# Patient Record
Sex: Female | Born: 1990 | Race: Black or African American | Hispanic: No | Marital: Single | Smoking: Never smoker
Health system: Southern US, Community
[De-identification: ages and names within clinical notes are randomized; demographics above are authoritative.]

## PROBLEM LIST (undated history)

## (undated) DIAGNOSIS — Z789 Other specified health status: Secondary | ICD-10-CM

## (undated) HISTORY — PX: NO PAST SURGERIES: SHX2092

---
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
## 2010-03-12 ENCOUNTER — Emergency Department (HOSPITAL_COMMUNITY)
Admission: EM | Admit: 2010-03-12 | Discharge: 2010-03-12 | Payer: Self-pay | Source: Home / Self Care | Admitting: Family Medicine

## 2010-06-30 ENCOUNTER — Other Ambulatory Visit (HOSPITAL_COMMUNITY)
Admission: RE | Admit: 2010-06-30 | Discharge: 2010-06-30 | Disposition: A | Discharge status: Home / Self Care | Payer: Self-pay | Source: Ambulatory Visit | Attending: Family Medicine | Admitting: Family Medicine

## 2010-06-30 DIAGNOSIS — Z0000 Encounter for general adult medical examination without abnormal findings: Secondary | ICD-10-CM | POA: Insufficient documentation

## 2010-06-30 DIAGNOSIS — Z113 Encounter for screening for infections with a predominantly sexual mode of transmission: Secondary | ICD-10-CM | POA: Insufficient documentation

## 2010-07-23 ENCOUNTER — Emergency Department (HOSPITAL_COMMUNITY)
Admission: EM | Admit: 2010-07-23 | Discharge: 2010-07-23 | Disposition: A | Discharge status: Home / Self Care | Payer: No Typology Code available for payment source | Attending: Emergency Medicine | Admitting: Emergency Medicine

## 2010-07-23 DIAGNOSIS — M542 Cervicalgia: Secondary | ICD-10-CM | POA: Insufficient documentation

## 2010-07-23 DIAGNOSIS — S139XXA Sprain of joints and ligaments of unspecified parts of neck, initial encounter: Secondary | ICD-10-CM | POA: Insufficient documentation

## 2010-07-23 DIAGNOSIS — R51 Headache: Secondary | ICD-10-CM | POA: Insufficient documentation

## 2010-07-23 DIAGNOSIS — M549 Dorsalgia, unspecified: Secondary | ICD-10-CM | POA: Insufficient documentation

## 2010-07-23 LAB — POCT PREGNANCY, URINE: Preg Test, Ur: NEGATIVE

## 2011-12-02 ENCOUNTER — Other Ambulatory Visit (HOSPITAL_COMMUNITY)
Admission: RE | Admit: 2011-12-02 | Discharge: 2011-12-02 | Disposition: A | Discharge status: Home / Self Care | Payer: Managed Care, Other (non HMO) | Source: Ambulatory Visit | Attending: Family Medicine | Admitting: Family Medicine

## 2011-12-02 DIAGNOSIS — Z01419 Encounter for gynecological examination (general) (routine) without abnormal findings: Secondary | ICD-10-CM | POA: Insufficient documentation

## 2011-12-02 DIAGNOSIS — Z113 Encounter for screening for infections with a predominantly sexual mode of transmission: Secondary | ICD-10-CM | POA: Insufficient documentation

## 2012-02-03 LAB — OB RESULTS CONSOLE RUBELLA ANTIBODY, IGM: Rubella: IMMUNE

## 2012-02-03 LAB — OB RESULTS CONSOLE ANTIBODY SCREEN: Antibody Screen: NEGATIVE

## 2012-02-03 LAB — OB RESULTS CONSOLE GC/CHLAMYDIA
Chlamydia: NEGATIVE
Gonorrhea: NEGATIVE

## 2012-06-22 ENCOUNTER — Inpatient Hospital Stay (HOSPITAL_COMMUNITY)
Admission: AD | Admit: 2012-06-22 | Discharge: 2012-06-22 | Disposition: A | Discharge status: Home / Self Care | Payer: Managed Care, Other (non HMO) | Source: Ambulatory Visit | Attending: Obstetrics and Gynecology | Admitting: Obstetrics and Gynecology

## 2012-06-22 ENCOUNTER — Encounter (HOSPITAL_COMMUNITY): Payer: Self-pay | Admitting: *Deleted

## 2012-06-22 DIAGNOSIS — M549 Dorsalgia, unspecified: Secondary | ICD-10-CM | POA: Insufficient documentation

## 2012-06-22 DIAGNOSIS — O479 False labor, unspecified: Secondary | ICD-10-CM | POA: Insufficient documentation

## 2012-06-22 NOTE — MAU Note (Signed)
Pt c/o of contracting and nonstop lower back pain

## 2012-06-22 NOTE — Treatment Plan (Signed)
Dr Ellyn Hack notified of patients cervical exam. Order to d/c home with labor precautions

## 2012-06-24 ENCOUNTER — Inpatient Hospital Stay (HOSPITAL_COMMUNITY): Payer: Managed Care, Other (non HMO) | Admitting: Anesthesiology

## 2012-06-24 ENCOUNTER — Encounter (HOSPITAL_COMMUNITY): Payer: Self-pay | Admitting: Anesthesiology

## 2012-06-24 ENCOUNTER — Encounter (HOSPITAL_COMMUNITY): Payer: Self-pay

## 2012-06-24 ENCOUNTER — Inpatient Hospital Stay (HOSPITAL_COMMUNITY)
Admission: AD | Admit: 2012-06-24 | Discharge: 2012-06-26 | DRG: 775 | Disposition: A | Discharge status: Home / Self Care | Payer: Managed Care, Other (non HMO) | Source: Ambulatory Visit | Attending: Obstetrics and Gynecology | Admitting: Obstetrics and Gynecology

## 2012-06-24 DIAGNOSIS — D573 Sickle-cell trait: Secondary | ICD-10-CM | POA: Diagnosis present

## 2012-06-24 DIAGNOSIS — O358XX0 Maternal care for other (suspected) fetal abnormality and damage, not applicable or unspecified: Secondary | ICD-10-CM | POA: Diagnosis present

## 2012-06-24 DIAGNOSIS — O9902 Anemia complicating childbirth: Principal | ICD-10-CM | POA: Diagnosis present

## 2012-06-24 HISTORY — DX: Other specified health status: Z78.9

## 2012-06-24 LAB — CBC
MCHC: 33.7 g/dL (ref 30.0–36.0)
Platelets: 203 10*3/uL (ref 150–400)
RDW: 14.1 % (ref 11.5–15.5)
WBC: 9.8 10*3/uL (ref 4.0–10.5)

## 2012-06-24 MED ORDER — BUTORPHANOL TARTRATE 1 MG/ML IJ SOLN
1.0000 mg | INTRAMUSCULAR | Status: DC | PRN
  Administered 2012-06-24: 1 mg via INTRAVENOUS
  Filled 2012-06-24: qty 1

## 2012-06-24 MED ORDER — PRENATAL MULTIVITAMIN CH: 1.0000 | ORAL_TABLET | Freq: Every day | ORAL | Status: DC

## 2012-06-24 MED ORDER — EPHEDRINE 5 MG/ML INJ
10.0000 mg | INTRAVENOUS | Status: DC | PRN
  Filled 2012-06-24: qty 2

## 2012-06-24 MED ORDER — ONDANSETRON HCL 4 MG PO TABS: 4.0000 mg | ORAL_TABLET | ORAL | Status: DC | PRN

## 2012-06-24 MED ORDER — PRENATAL MULTIVITAMIN CH
1.0000 | ORAL_TABLET | Freq: Every day | ORAL | Status: DC
  Administered 2012-06-25 – 2012-06-26 (×2): 1 via ORAL
  Filled 2012-06-24 (×2): qty 1

## 2012-06-24 MED ORDER — OXYTOCIN 40 UNITS IN LACTATED RINGERS INFUSION - SIMPLE MED: 62.5000 mL/h | INTRAVENOUS | Status: DC

## 2012-06-24 MED ORDER — LIDOCAINE HCL (PF) 1 % IJ SOLN
INTRAMUSCULAR | Status: DC | PRN
  Administered 2012-06-24 (×2): 4 mL

## 2012-06-24 MED ORDER — PHENYLEPHRINE 40 MCG/ML (10ML) SYRINGE FOR IV PUSH (FOR BLOOD PRESSURE SUPPORT)
80.0000 ug | PREFILLED_SYRINGE | INTRAVENOUS | Status: DC | PRN
  Filled 2012-06-24: qty 2
  Filled 2012-06-24: qty 5

## 2012-06-24 MED ORDER — IBUPROFEN 600 MG PO TABS: 600.0000 mg | ORAL_TABLET | Freq: Four times a day (QID) | ORAL | Status: DC | PRN

## 2012-06-24 MED ORDER — EPHEDRINE 5 MG/ML INJ
10.0000 mg | INTRAVENOUS | Status: DC | PRN
  Filled 2012-06-24: qty 4
  Filled 2012-06-24: qty 2

## 2012-06-24 MED ORDER — LACTATED RINGERS IV SOLN: 500.0000 mL | INTRAVENOUS | Status: DC | PRN

## 2012-06-24 MED ORDER — TERBUTALINE SULFATE 1 MG/ML IJ SOLN: 0.2500 mg | Freq: Once | INTRAMUSCULAR | Status: DC | PRN

## 2012-06-24 MED ORDER — BENZOCAINE-MENTHOL 20-0.5 % EX AERO: 1.0000 "application " | INHALATION_SPRAY | CUTANEOUS | Status: DC | PRN

## 2012-06-24 MED ORDER — FLEET ENEMA 7-19 GM/118ML RE ENEM: 1.0000 | ENEMA | RECTAL | Status: DC | PRN

## 2012-06-24 MED ORDER — DIPHENHYDRAMINE HCL 50 MG/ML IJ SOLN: 12.5000 mg | INTRAMUSCULAR | Status: DC | PRN

## 2012-06-24 MED ORDER — ONDANSETRON HCL 4 MG/2ML IJ SOLN: 4.0000 mg | Freq: Four times a day (QID) | INTRAMUSCULAR | Status: DC | PRN

## 2012-06-24 MED ORDER — ONDANSETRON HCL 4 MG/2ML IJ SOLN: 4.0000 mg | INTRAMUSCULAR | Status: DC | PRN

## 2012-06-24 MED ORDER — LIDOCAINE HCL (PF) 1 % IJ SOLN
30.0000 mL | INTRAMUSCULAR | Status: DC | PRN
  Filled 2012-06-24 (×2): qty 30

## 2012-06-24 MED ORDER — FENTANYL 2.5 MCG/ML BUPIVACAINE 1/10 % EPIDURAL INFUSION (WH - ANES)
INTRAMUSCULAR | Status: DC | PRN
  Administered 2012-06-24: 14 mL/h via EPIDURAL

## 2012-06-24 MED ORDER — OXYTOCIN BOLUS FROM INFUSION
500.0000 mL | INTRAVENOUS | Status: DC
  Administered 2012-06-24: 500 mL via INTRAVENOUS

## 2012-06-24 MED ORDER — LACTATED RINGERS IV SOLN
INTRAVENOUS | Status: DC
Start: 2012-06-24 — End: 2012-06-24
  Administered 2012-06-24 (×2): via INTRAVENOUS

## 2012-06-24 MED ORDER — WITCH HAZEL-GLYCERIN EX PADS: 1.0000 "application " | MEDICATED_PAD | CUTANEOUS | Status: DC | PRN

## 2012-06-24 MED ORDER — SIMETHICONE 80 MG PO CHEW: 80.0000 mg | CHEWABLE_TABLET | ORAL | Status: DC | PRN

## 2012-06-24 MED ORDER — LACTATED RINGERS IV SOLN: INTRAVENOUS | Status: AC

## 2012-06-24 MED ORDER — OXYCODONE-ACETAMINOPHEN 5-325 MG PO TABS: 1.0000 | ORAL_TABLET | ORAL | Status: DC | PRN

## 2012-06-24 MED ORDER — LACTATED RINGERS IV SOLN
500.0000 mL | Freq: Once | INTRAVENOUS | Status: AC
  Administered 2012-06-24: 500 mL via INTRAVENOUS

## 2012-06-24 MED ORDER — CITRIC ACID-SODIUM CITRATE 334-500 MG/5ML PO SOLN: 30.0000 mL | ORAL | Status: DC | PRN

## 2012-06-24 MED ORDER — OXYTOCIN 40 UNITS IN LACTATED RINGERS INFUSION - SIMPLE MED
1.0000 m[IU]/min | INTRAVENOUS | Status: DC
  Administered 2012-06-24: 2 m[IU]/min via INTRAVENOUS
  Filled 2012-06-24: qty 1000

## 2012-06-24 MED ORDER — IBUPROFEN 600 MG PO TABS
600.0000 mg | ORAL_TABLET | Freq: Four times a day (QID) | ORAL | Status: DC
  Administered 2012-06-24 – 2012-06-26 (×8): 600 mg via ORAL
  Filled 2012-06-24 (×7): qty 1

## 2012-06-24 MED ORDER — FENTANYL 2.5 MCG/ML BUPIVACAINE 1/10 % EPIDURAL INFUSION (WH - ANES)
14.0000 mL/h | INTRAMUSCULAR | Status: DC | PRN
  Filled 2012-06-24: qty 125

## 2012-06-24 MED ORDER — OXYTOCIN 40 UNITS IN LACTATED RINGERS INFUSION - SIMPLE MED: 62.5000 mL/h | INTRAVENOUS | Status: AC | PRN

## 2012-06-24 MED ORDER — DIBUCAINE 1 % RE OINT: 1.0000 "application " | TOPICAL_OINTMENT | RECTAL | Status: DC | PRN

## 2012-06-24 MED ORDER — ZOLPIDEM TARTRATE 5 MG PO TABS: 5.0000 mg | ORAL_TABLET | Freq: Every evening | ORAL | Status: DC | PRN

## 2012-06-24 MED ORDER — DIPHENHYDRAMINE HCL 25 MG PO CAPS: 25.0000 mg | ORAL_CAPSULE | Freq: Four times a day (QID) | ORAL | Status: DC | PRN

## 2012-06-24 MED ORDER — LANOLIN HYDROUS EX OINT: TOPICAL_OINTMENT | CUTANEOUS | Status: DC | PRN

## 2012-06-24 MED ORDER — TETANUS-DIPHTH-ACELL PERTUSSIS 5-2.5-18.5 LF-MCG/0.5 IM SUSP: 0.5000 mL | Freq: Once | INTRAMUSCULAR | Status: DC

## 2012-06-24 MED ORDER — FENTANYL 2.5 MCG/ML BUPIVACAINE 1/10 % EPIDURAL INFUSION (WH - ANES)
14.0000 mL/h | INTRAMUSCULAR | Status: DC | PRN
Start: 2012-06-24 — End: 2012-06-24

## 2012-06-24 MED ORDER — MEASLES, MUMPS & RUBELLA VAC ~~LOC~~ INJ
0.5000 mL | INJECTION | Freq: Once | SUBCUTANEOUS | Status: DC
  Filled 2012-06-24: qty 0.5

## 2012-06-24 MED ORDER — PHENYLEPHRINE 40 MCG/ML (10ML) SYRINGE FOR IV PUSH (FOR BLOOD PRESSURE SUPPORT)
80.0000 ug | PREFILLED_SYRINGE | INTRAVENOUS | Status: DC | PRN
  Filled 2012-06-24: qty 2

## 2012-06-24 MED ORDER — INFLUENZA VIRUS VACC SPLIT PF IM SUSP
0.5000 mL | INTRAMUSCULAR | Status: AC
  Administered 2012-06-26: 0.5 mL via INTRAMUSCULAR
  Filled 2012-06-24: qty 0.5

## 2012-06-24 MED ORDER — ACETAMINOPHEN 325 MG PO TABS: 650.0000 mg | ORAL_TABLET | ORAL | Status: DC | PRN

## 2012-06-24 MED ORDER — SENNOSIDES-DOCUSATE SODIUM 8.6-50 MG PO TABS
2.0000 | ORAL_TABLET | Freq: Every day | ORAL | Status: DC
  Administered 2012-06-25: 2 via ORAL

## 2012-06-24 NOTE — Progress Notes (Signed)
Patient ID: Rose Barnes, female   DOB: 1991-01-25, 22 y.o.   MRN: [id number] Contractions are q 2-3 minutes and the cervix is 10 cm 100% effaced and the vertex is at 0/+1 station.

## 2012-06-24 NOTE — Progress Notes (Signed)
Patient ID: Rose Barnes, female   DOB: [birth year], 22 y.o.   MRN: [id number] Pt was admitted and has an epidural. The RN just examined her and the cervix was 3-4 cm 80-90 % effaced and the vertex was at - 1 station Pitocin is at 2 mu/ minute

## 2012-06-24 NOTE — Anesthesia Postprocedure Evaluation (Signed)
Anesthesia Post Note  Patient: @Rose Barnes @Rose Barnes   Procedure(s) Performed: CLE/C/S  Anesthesia type: Epidural  Patient location: Mother/Baby  Post pain: Pain level controlled  Post assessment: Post-op Vital signs reviewed  Last Vitals: BP 115/76  Pulse 74  Temp(Src) 36.6 C (Axillary)  Resp 20  SpO2 100%  Post vital signs: Reviewed  Level of consciousness: awake  Complications: No apparent anesthesia complications

## 2012-06-24 NOTE — Anesthesia Preprocedure Evaluation (Signed)
Anesthesia Evaluation  Patient identified by MRN, date of birth, ID band Patient awake    Reviewed: Allergy & Precautions, H&P , NPO status , Patient's Chart, lab work & pertinent test results  History of Anesthesia Complications (+) PONV  Airway Mallampati: III TM Distance: >3 FB Neck ROM: full    Dental no notable dental hx. (+) Teeth Intact   Pulmonary neg pulmonary ROS,  breath sounds clear to auscultation  Pulmonary exam normal       Cardiovascular negative cardio ROS  Rhythm:regular Rate:Normal     Neuro/Psych negative neurological ROS  negative psych ROS   GI/Hepatic negative GI ROS, Neg liver ROS,   Endo/Other  negative endocrine ROS  Renal/GU negative Renal ROS  negative genitourinary   Musculoskeletal negative musculoskeletal ROS (+)   Abdominal Normal abdominal exam  (+)   Peds  Hematology negative hematology ROS (+)   Anesthesia Other Findings   Reproductive/Obstetrics (+) Pregnancy                           Anesthesia Physical Anesthesia Plan  ASA: II  Anesthesia Plan: Epidural   Post-op Pain Management:    Induction:   Airway Management Planned:   Additional Equipment:   Intra-op Plan:   Post-operative Plan:   Informed Consent: I have reviewed the patients History and Physical, chart, labs and discussed the procedure including the risks, benefits and alternatives for the proposed anesthesia with the patient or authorized representative who has indicated his/her understanding and acceptance.     Plan Discussed with: Anesthesiologist  Anesthesia Plan Comments:         Anesthesia Quick Evaluation

## 2012-06-24 NOTE — MAU Note (Signed)
Pt states contractions started yesterday and are about every 1.5-2 minutes apart. Denies leaking of fluid or vaginal bleeding

## 2012-06-24 NOTE — Progress Notes (Signed)
Patient ID: Rose Barnes, female   DOB: March 04, 1991, 22 y.o.   MRN: [id number] Pitocin at 6 mu/ minute and the contractions are q 2-3 minutes. She had SROM at 11:30 AM and the cervix was 4 cm at that time. Now, the cervix is 7 cm 100 % effaced and the vertex is at 0 station.

## 2012-06-24 NOTE — H&P (Signed)
Rose Barnes is a 22 y.o. female G1P0 at 47 weeks (EDD 07/11/12 by LMP c/w 18 week [place])  presenting for early labor with cervical change to 3 cm and very uncomfortable.  Prenatal care significant for a late start at 18 weeks.  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  She has been followed up with a normal growth [place] and seen a pediatric orthopedist with a plan to bring the baby to them for evaluation at 2 weeks postpartum.  She is a sickle trait carrier.  Otherwise no issues except a UTI at 18 weeks treated with bactrim.  Maternal Medical History:  Reason for admission: Contractions.   Contractions: Onset was 3-5 hours ago.   Frequency: regular.   Perceived severity is moderate.    Fetal activity: Perceived fetal activity is normal.    Prenatal Complications - Diabetes: none.    OB History   Grav Para Term Preterm Abortions TAB SAB Ect Mult Living   1              Past Medical History  Diagnosis Date  . Medical history non-contributory    Past Surgical History  Procedure Laterality Date  . No past surgeries     Family History: family history is not on file. Social History:  reports that she has never smoked. She does not have any smokeless tobacco history on file. She reports that she does not drink alcohol or use illicit drugs.   Prenatal Transfer Tool  Maternal Diabetes: No Genetic Screening: Normal Maternal Ultrasounds/Referrals: Abnormal:  Findings:   Other:Bilateral Club feet Fetal Ultrasounds or other Referrals:  Other: Pediatric orthopedist Maternal Substance Abuse:  No Significant Maternal Medications:  None Significant Maternal Lab Results:  None Other Comments:  Pt is a sickle trait carrier.  ROS  Dilation: 3 Effacement (%): 80 Station: -2 Exam by:: L. Dupell, RN  Blood pressure 109/75, pulse 80, temperature 98.6 F (37 C), temperature source Oral, resp. rate 20, SpO2 99.00%. Maternal Exam:  Uterine Assessment:  Contraction strength is moderate.  Contraction frequency is regular.   Abdomen: Patient reports no abdominal tenderness. Fetal presentation: vertex  Introitus: Normal vulva. Normal vagina.  Pelvis: adequate for delivery.      Physical Exam  Constitutional: She appears well-developed and well-nourished.  Cardiovascular: Normal rate and regular rhythm.   Respiratory: Effort normal and breath sounds normal.  GI: Soft.  Genitourinary: Vagina normal.  Uterus gravid  Neurological: She is alert.  Psychiatric: She has a normal mood and affect. Her behavior is normal.    Prenatal labs: ABO, Rh: B/Positive/-- (11/06 0000) Antibody: Negative (11/06 0000) Rubella: Immune (11/06 0000) RPR: Nonreactive (11/06 0000)  HBsAg: Negative (11/06 0000)  HIV: Non-reactive (11/06 0000)  GBS: Negative (03/28 0000)  One hour GCT 108 Sickle trait carrier CF negative Quad screen negative  Assessment/Plan: Pt came in to MAU uncomfortable and was observed from 2am to 430am with cervical change from 2 to 3cm per RN.  She also c/o excessive discomfort and requested pain management.  She is just receiving an epidural.  The contractions are 1-6 minutes and I instructed the RN to begin pitocin after the epidural if the contractions were not regular.  Oliver Pila 06/24/2012, 7:29 AM

## 2012-06-24 NOTE — Progress Notes (Signed)
Patient ID: Rose Barnes, female   DOB: 02/19/1991, 22 y.o.   MRN: [id number] Delivery note  Pt pushed well and delivered a living female infant OA over an intact perineum. Apgars 9 and 9 at 1 and 5 minutes. The placenta was intact and the uterus was normal. There were no lacerations. The baby's feet did not look excessively clubbed. EBL 300 cc's.

## 2012-06-24 NOTE — Anesthesia Procedure Notes (Signed)
Epidural Patient location during procedure: OB Start time: 06/24/2012 7:10 AM  Staffing Anesthesiologist: Jeremian Whitby A. Performed by: anesthesiologist   Preanesthetic Checklist Completed: patient identified, site marked, surgical consent, pre-op evaluation, timeout performed, IV checked, risks and benefits discussed and monitors and equipment checked  Epidural Patient position: sitting Prep: site prepped and draped and DuraPrep Patient monitoring: continuous pulse ox and blood pressure Approach: midline Injection technique: LOR air  Needle:  Needle type: Tuohy  Needle gauge: 17 G Needle length: 9 cm and 9 Needle insertion depth: 4 cm Catheter type: closed end flexible Catheter size: 19 Gauge Catheter at skin depth: 9 cm Test dose: negative and Other  Assessment Events: blood not aspirated, injection not painful, no injection resistance, negative IV test and no paresthesia  Additional Notes Patient identified. Risks and benefits discussed including failed block, incomplete  Pain control, post dural puncture headache, nerve damage, paralysis, blood pressure Changes, nausea, vomiting, reactions to medications-both toxic and allergic and post Partum back pain. All questions were answered. Patient expressed understanding and wished to proceed. Sterile technique was used throughout procedure. Epidural site was Dressed with sterile barrier dressing. No paresthesias, signs of intravascular injection Or signs of intrathecal spread were encountered.  Patient was more comfortable after the epidural was dosed. Please see RN's note for documentation of vital signs and FHR which are stable.

## 2012-06-25 LAB — CBC
HCT: 31.7 % — ABNORMAL LOW (ref 36.0–46.0)
Hemoglobin: 10.7 g/dL — ABNORMAL LOW (ref 12.0–15.0)
MCHC: 33.8 g/dL (ref 30.0–36.0)
RBC: 4.12 MIL/uL (ref 3.87–5.11)

## 2012-06-25 NOTE — Lactation Note (Signed)
This note was copied from the chart of Rose Kylieann Farrel. Lactation Consultation Note Mom holding baby in football on the left when I enter room. The baby's position and latch were perfect, and mom was comfortable and relaxed. Asked mom if she had help getting baby positioned like that, and she said that in previous feedings, she did receive help from RN, and at this feeding, just mom and MGM were able to position baby. Mom states latch is comfortable. Mom states she is able to hand express. Baby maintains deep latch with rhythmic sucking and audible swallowing.  Mom and MGM had numerous questions; extensive teaching done and lots of encouragement given. Lactation brochure provided, mom made aware of lactation services and [facility]. Enc mom to call if she has any concerns.  Patient Name: Rose Barnes YNWGN'F Date: 06/25/2012     Maternal Data    Feeding    LATCH Score/Interventions                      Lactation Tools Discussed/Used     Consult Status      Lenard Forth 06/25/2012, 3:06 PM

## 2012-06-25 NOTE — Progress Notes (Signed)
Patient ID: Rose Barnes, female   DOB: [birth year], 22 y.o.   MRN: [id number] #1 afebrile BP normal HGB stable no complaints

## 2012-06-26 MED ORDER — OXYCODONE-ACETAMINOPHEN 5-325 MG PO TABS: 1.0000 | ORAL_TABLET | Freq: Four times a day (QID) | ORAL | Status: DC | PRN

## 2012-06-26 MED ORDER — IBUPROFEN 600 MG PO TABS: 600.0000 mg | ORAL_TABLET | Freq: Four times a day (QID) | ORAL | Status: DC | PRN

## 2012-06-26 NOTE — Progress Notes (Signed)
Patient ID: Rose Barnes, female   DOB: 1990-09-21, 22 y.o.   MRN: [id number] #2 afebrile BP normal for d/c

## 2012-06-26 NOTE — Discharge Summary (Signed)
[name], Rose Barnes NO.:  [id number]  MEDICAL RECORD NO.:  [id number]  LOCATION:  9142                          FACILITY:  [facility]  PHYSICIAN:  Malachi Pro. Ambrose Mantle, M.D. DATE OF BIRTH:  1991-03-18  DATE OF ADMISSION:  06/24/2012 DATE OF DISCHARGE:  06/26/2012                              DISCHARGE SUMMARY   A 22 year old female, para 0, gravida 1, EDC July 11, 2012, by last period compatible with an 18-week ultrasound, presented for early labor with cervical change to 3 cm and very uncomfortable.  Prenatal care significant for late started 18 weeks.  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  She had been followed up with a normal growth scan on ultrasound and she saw a pediatric orthopedist with a plan to bring the baby to them for evaluation at 2 weeks' postpartum.  She was a sickle trait carrier, otherwise no issues except for urinary tract at 18 weeks, treated with Bactrim.  Blood group and type B positive, negative antibody, rubella immune, RPR nonreactive.  Hepatitis B surface antigen negative, HIV negative, group B strep negative.  One-hour Glucola 108, sickle trait carrier, cystic fibrosis negative, quad screen negative.  The patient came into MAU uncomfortable and was observed from 2 a.m. to 4:30 a.m. with a cervical change from 2-3 cm per the RN.  She also complained of excessive discomfort and requested pain management.  She received an epidural and was admitted by Dr. Senaida Ores.  Contractions were every 1- 6 minutes and subsequently the patient received an epidural.  At 8:06 a.m., the cervix was 3-4 cm dilated, 80% to 90% effaced, vertex at a -1 station.  Pitocin was at 2 milliunits a minute.  At 12:52 a.m., Pitocin was at 6 milliunits a minute.  Contractions every 2-3 minutes.  She had spontaneous rupture of membranes at 11:30 a.m., cervix was 4 cm at that time.  At 12:52 p.m., the cervix was 7 cm and  100% vertex at 0 station. She was 10 cm at 1:57 p.m.  She pushed well and delivered a living female infant 7 pounds 11 ounces OA over an intact perineum by Dr. Ambrose Mantle. Apgars were 9 and 9 at one and five minutes.  Placenta was intact. Uterus normal.  No lacerations.  Baby's feet did not look excessively clubbed.  Blood loss about 300 mL.  Postpartum, the patient did well and was discharged on the second postpartum day.  Initial hemoglobin 11.6, hematocrit 34.4, white count 9800, platelet count 203,000.  Followup hemoglobin 10.7.  RPR nonreactive.  FINAL DIAGNOSES:  Intrauterine pregnancy close to 38 weeks, delivered OA.  OPERATION:  Spontaneous delivery, OA.  FINAL CONDITION:  Improved.  INSTRUCTIONS:  Include our regular discharge instruction booklet as well as our after visit summary.  She is advised to continue her prenatal pills.  Prescriptions for Percocet 5/325, 30 tablets and Motrin 600 mg 30 tablets, 1 every 6 hours as needed for pain were given at discharge. She is to return to the office in 6 weeks for followup examination.     Malachi Pro. Ambrose Mantle, M.D.     TFH/MEDQ  D:  06/26/2012  T:  06/26/2012  Job:  [id number]

## 2012-07-05 ENCOUNTER — Inpatient Hospital Stay (HOSPITAL_COMMUNITY): Admission: RE | Admit: 2012-07-05 | Payer: No Typology Code available for payment source | Source: Ambulatory Visit

## 2012-07-08 ENCOUNTER — Ambulatory Visit (HOSPITAL_COMMUNITY)
Admission: RE | Admit: 2012-07-08 | Discharge: 2012-07-08 | Disposition: A | Discharge status: Home / Self Care | Payer: Managed Care, Other (non HMO) | Source: Ambulatory Visit | Attending: Pediatrics | Admitting: Pediatrics

## 2012-07-08 NOTE — Lactation Note (Signed)
Infant Lactation Consultation Outpatient Visit Note  Patient Name: Rose Barnes Date of Birth: 04/28/1990                06/24/12 Birth Weight:                                  7-11.6    Hospital D?C wt  7-6    First MD visit 7-12     Today's wt 8-6.6           Gestational Age at Delivery: Gestational Age: <None> Type of Delivery:   Breastfeeding History Frequency of Breastfeeding: Mom stopped breast feeding after a week due to sore nipples, and has been pumping and bottle feeding He is now 33 days old  She has given formula only once Length of Feeding:  Voids: 10 per day Stools: 7 per day  Supplementing / Method: Pumping:   Type of Pump:Medela DEP   Frequency:6 times a day, with an 8 hour break at night  Volume:  Was initially getting 6 ounces at a time, but over the last 2 days, mom has a decreased supply of 2 ounces at a pumping  Comments:   Mom was losing her supply by not pumping for 8 hours, at night.    Consultation Evaluation:   On exam, mom's breasts are full, soft, with easy to express breast milk. I had mom breast feed twice on left breast, in football hold, and twice on left, also football hold. He transferred 80 mls in about 40 minutes. Mom was surprised that latching did not hurt. She has been bringing her breast to the baby, and obtaining a shallow latch. With proper positioning, and bringing  Rose Barnes to her breast, waiting for a wide mouth, mom was able to latch Rose Barnes deeply, with strong suckles and lots of audible swallows. Mom plans to breast feed on cue, and only pump to store milk for when she needs to go back to work. I feel her  Milk supply will be fine with ad lib breast feeding. She knows to call lactation for questions/concerns and out-patient lactation as needed. Mom also taught to empty one breast before going to the other, to provide hind milk to her baby. [facility] [employer] was very clam, and appeared satiated, after feeding  Initial Feeding  Assessment: Pre-feed Weight:3816 Post-feed RUEAVW:0981 Amount Transferred:40 Comments:right breast   Additional Feeding Assessment: Pre-feed XBJYNW:2956 Post-feed OZHYQM:5784 Amount Transferred:12 Comments:right breast  Additional Feeding Assessment: Pre-feed ONGEXB:2841 Post-feed LKGMWN:0272 Amount Transferred:12 Comments:  Left breast      Fed from left breast again -  3896 - 16 mls   Total Breast milk Transferred this Visit: 80 mls  Total Supplement Given: none  Additional Interventions:   Follow-Up mom will call for questions/concerns, or follow-up lactation consult, as needed      Alfred Levins 07/08/2012, 4:07 PM

## 2013-02-27 ENCOUNTER — Emergency Department (HOSPITAL_COMMUNITY)
Admission: EM | Admit: 2013-02-27 | Discharge: 2013-02-27 | Disposition: A | Discharge status: Home / Self Care | Payer: Medicaid Other | Attending: Emergency Medicine | Admitting: Emergency Medicine

## 2013-02-27 ENCOUNTER — Encounter (HOSPITAL_COMMUNITY): Payer: Self-pay | Admitting: Emergency Medicine

## 2013-02-27 DIAGNOSIS — W268XXA Contact with other sharp object(s), not elsewhere classified, initial encounter: Secondary | ICD-10-CM | POA: Insufficient documentation

## 2013-02-27 DIAGNOSIS — Y929 Unspecified place or not applicable: Secondary | ICD-10-CM | POA: Insufficient documentation

## 2013-02-27 DIAGNOSIS — S61209A Unspecified open wound of unspecified finger without damage to nail, initial encounter: Secondary | ICD-10-CM | POA: Insufficient documentation

## 2013-02-27 DIAGNOSIS — Y93G1 Activity, food preparation and clean up: Secondary | ICD-10-CM | POA: Insufficient documentation

## 2013-02-27 DIAGNOSIS — S61211A Laceration without foreign body of left index finger without damage to nail, initial encounter: Secondary | ICD-10-CM

## 2013-02-27 DIAGNOSIS — Z23 Encounter for immunization: Secondary | ICD-10-CM | POA: Insufficient documentation

## 2013-02-27 MED ORDER — TETANUS-DIPHTH-ACELL PERTUSSIS 5-2.5-18.5 LF-MCG/0.5 IM SUSP
0.5000 mL | Freq: Once | INTRAMUSCULAR | Status: AC
  Administered 2013-02-27: 0.5 mL via INTRAMUSCULAR
  Filled 2013-02-27: qty 0.5

## 2013-02-27 NOTE — ED Notes (Signed)
Bacitracin placed on laceration and bandaged, patient given instructions for future bandages

## 2013-02-27 NOTE — ED Provider Notes (Signed)
CSN: [id number]     Arrival date & time 02/27/13  0272 History   First MD Initiated Contact with Patient 02/27/13 0804     Chief Complaint  Patient presents with  . Laceration    left hand   (Consider location/radiation/quality/duration/timing/severity/associated sxs/prior Treatment) HPI Comments: Patient is a 23 year old female who presents today with laceration to her proximal left index finger. Late last night she was washing dishes when a broken dish cut her hand. She rinsed it out and covered it with Neosporin. She is having a burning pain to the area, worse with movement. She is left hand dominant. She is unsure of her last tetanus shot. No other medical problems.   The history is provided by the patient. No language interpreter was used.    Past Medical History  Diagnosis Date  . Medical history non-contributory    Past Surgical History  Procedure Laterality Date  . No past surgeries     No family history on file. History  Substance Use Topics  . Smoking status: Never Smoker   . Smokeless tobacco: Not on file  . Alcohol Use: No   OB History   Grav Para Term Preterm Abortions TAB SAB Ect Mult Living   1 1 1       1      Review of Systems  Constitutional: Negative for fever and chills.  Respiratory: Negative for shortness of breath.   Cardiovascular: Negative for chest pain.  Musculoskeletal: Positive for myalgias.  Skin: Positive for wound.  Neurological: Negative for numbness.  All other systems reviewed and are negative.    Allergies  Review of patient's allergies indicates no known allergies.  Home Medications   Current Outpatient Rx  Name  Route  Sig  Dispense  Refill  . calcium carbonate (TUMS - DOSED IN MG ELEMENTAL CALCIUM) 500 MG chewable tablet   Oral   Chew 1 tablet by mouth daily as needed for heartburn.         . diphenhydrAMINE (BENADRYL) 25 MG tablet   Oral   Take 25 mg by mouth every 6 (six) hours as needed for itching.         Marland Kitchen  ibuprofen (ADVIL,MOTRIN) 600 MG tablet   Oral   Take 1 tablet (600 mg total) by mouth every 6 (six) hours as needed for pain.   30 tablet   0   . oxyCODONE-acetaminophen (PERCOCET/ROXICET) 5-325 MG per tablet   Oral   Take 1 tablet by mouth every 6 (six) hours as needed.   30 tablet   0   . Prenatal Vit-Fe Fumarate-FA (PRENATAL MULTIVITAMIN) TABS   Oral   Take 1 tablet by mouth daily at 12 noon.          BP 125/82  Pulse 85  Temp(Src) 97.1 F (36.2 C) (Oral)  Resp 16  SpO2 100%  LMP 01/01/2013  Breastfeeding? No Physical Exam  Nursing note and vitals reviewed. Constitutional: She is oriented to person, place, and time. She appears well-developed and well-nourished. No distress.  HENT:  Head: Normocephalic and atraumatic.  Right Ear: External ear normal.  Left Ear: External ear normal.  Nose: Nose normal.  Mouth/Throat: Oropharynx is clear and moist.  Eyes: Conjunctivae are normal.  Neck: Normal range of motion.  Cardiovascular: Normal rate, regular rhythm, normal heart sounds, intact distal pulses and normal pulses.   Cap refill < 3 seconds in all fingers  Pulmonary/Chest: Effort normal and breath sounds normal. No stridor. No  respiratory distress. She has no wheezes. She has no rales.  Abdominal: Soft. She exhibits no distension.  Musculoskeletal: Normal range of motion.  Able to flex and extend left index finger. Strength mildly limited due to pain.   Neurological: She is alert and oriented to person, place, and time. She has normal strength.  Neurovascularly intact. Sensation intact.   Skin: Skin is warm and dry. She is not diaphoretic. No erythema.  2 cm elliptical laceration on proximal left index finger dorsally. No foreign bodies appreciated.   Psychiatric: She has a normal mood and affect. Her behavior is normal.    ED Course  Procedures (including critical care time) Labs Review Labs Reviewed - No data to display Imaging Review No results  found.  LACERATION REPAIR Performed by: Junious Silk Authorized by: Junious Silk Consent: Verbal consent obtained. Risks and benefits: risks, benefits and alternatives were discussed Consent given by: patient Patient identity confirmed: provided demographic data Prepped and Draped in normal sterile fashion Wound explored  Laceration Location: left index finger  Laceration Length: 2 cm  No Foreign Bodies seen or palpated  Anesthesia: local infiltration  Local anesthetic: lidocaine 2%  Anesthetic total: 3 ml  Irrigation method: syringe Amount of cleaning: standard  Skin closure: 4-0 Ethilon  Number of sutures: 4  Technique: simple interrupted   Patient tolerance: Patient tolerated the procedure well with no immediate complications.    EKG Interpretation   None       MDM   1. Laceration of left index finger w/o foreign body w/o damage to nail, initial encounter    Tdap booster given. Wound cleaning complete with pressure irrigation, bottom of wound visualized, no foreign bodies appreciated. Laceration occurred < 10 hours prior to repair which was well tolerated. Pt has no co morbidities to effect normal wound healing. Discussed suture home care w pt and answered questions. Pt to f-u for wound check and suture removal in 7 days. Pt is hemodynamically stable w no complaints prior to dc.       Mora Bellman, PA-C 02/27/13 [phone]

## 2013-02-27 NOTE — ED Provider Notes (Signed)
Medical screening examination/treatment/procedure(s) were performed by non-physician practitioner and as supervising physician I was immediately available for consultation/collaboration.  EKG Interpretation   None         Joya Gaskins, MD 02/27/13 [id number]

## 2013-02-27 NOTE — ED Notes (Signed)
Patient states cut hand while washing dishes last night, laceration to left index finger, bleeding controlled

## 2013-03-06 ENCOUNTER — Encounter (HOSPITAL_COMMUNITY): Payer: Self-pay | Admitting: Emergency Medicine

## 2013-03-06 ENCOUNTER — Emergency Department (HOSPITAL_COMMUNITY)
Admission: EM | Admit: 2013-03-06 | Discharge: 2013-03-06 | Disposition: A | Discharge status: Home / Self Care | Payer: Medicaid Other | Attending: Emergency Medicine | Admitting: Emergency Medicine

## 2013-03-06 DIAGNOSIS — Z4802 Encounter for removal of sutures: Secondary | ICD-10-CM | POA: Insufficient documentation

## 2013-03-06 NOTE — ED Provider Notes (Signed)
CSN: [id number]     Arrival date & time 03/06/13  0732 History   First MD Initiated Contact with Patient 03/06/13 0740     Chief Complaint  Patient presents with  . Suture / Staple Removal   (Consider location/radiation/quality/duration/timing/severity/associated sxs/prior Treatment) HPI Comments: Rose Barnes is a 22 yo female who presents for suture removal.  Lac to L hand near 2nd MCPJ.  Four sutures placed, but one has come out.  No purulent d/c or erythema today.  Pt did go to UC 1 day after sutures were placed and was given Abx (bactrim) and vicodin.  No issues since then.    Pt is o/w healthy w/o complaint.     Patient is a 22 y.o. female presenting with suture removal. The history is provided by the patient.  Suture / Staple Removal This is a new problem. The current episode started more than 2 days ago. The problem occurs constantly (Sutures placed 1 wk ago). The problem has been gradually improving. Pertinent negatives include no chest pain, no abdominal pain, no headaches and no shortness of breath. Nothing aggravates the symptoms. Nothing relieves the symptoms. She has tried nothing for the symptoms. The treatment provided significant relief.    Past Medical History  Diagnosis Date  . Medical history non-contributory    Past Surgical History  Procedure Laterality Date  . No past surgeries     No family history on file. History  Substance Use Topics  . Smoking status: Never Smoker   . Smokeless tobacco: Not on file  . Alcohol Use: No   OB History   Grav Para Term Preterm Abortions TAB SAB Ect Mult Living   1 1 1       1      Review of Systems  Constitutional: Negative.   Respiratory: Negative for shortness of breath.   Cardiovascular: Negative for chest pain.  Gastrointestinal: Negative for abdominal pain.  Musculoskeletal:       L hand laceration healing  Neurological: Negative for headaches.  All other systems reviewed and are negative.    Allergies    Review of patient's allergies indicates no known allergies.  Home Medications  No current outpatient prescriptions on file. BP 110/68  Pulse 60  Temp(Src) 98 F (36.7 C) (Oral)  Resp 20  SpO2 99%  LMP 01/01/2013 Physical Exam  Vitals reviewed. Constitutional: She is oriented to person, place, and time. She appears well-developed and well-nourished.  HENT:  Head: Normocephalic and atraumatic.  Eyes: Conjunctivae are normal. Pupils are equal, round, and reactive to light. Right eye exhibits no discharge. Left eye exhibits no discharge.  Neck: Normal range of motion. Neck supple.  Cardiovascular: Normal rate and regular rhythm.   Pulmonary/Chest: Effort normal and breath sounds normal. She has no wheezes. She has no rales.  Abdominal: Soft. Bowel sounds are normal. There is no tenderness. There is no rebound.  Musculoskeletal:       Hands: Neurological: She is alert and oriented to person, place, and time. She has normal reflexes.    ED Course  Procedures (including critical care time) Labs Review Labs Reviewed - No data to display Imaging Review No results found.  EKG Interpretation   None       MDM  No diagnosis found. Plan to remove sutures.  Keep wound dressed.  Continue with abx and pain rx as needed.  F/U prn.      Darlys Gales, MD 03/06/13 [phone]

## 2013-03-06 NOTE — ED Provider Notes (Signed)
7:55 AM Patient presents for suture removal. The wound is well healed without signs of infection.  The sutures are removed. Wound care and activity instructions given. Return prn.  SUTURE REMOVAL Performed by: Emilia Beck  Consent: Verbal consent obtained. Patient identity confirmed: provided demographic data Time out: Immediately prior to procedure a "time out" was called to verify the correct patient, procedure, equipment, support staff and site/side marked as required.  Location details: left hand, near 2nd MCPJ  Wound Appearance: clean  Sutures/Staples Removed: 3, 1 fell out yesterday  Facility: sutures placed in this facility Patient tolerance: Patient tolerated the procedure well with no immediate complications.     Emilia Beck, [place] 03/06/13 [phone]

## 2013-03-06 NOTE — ED Notes (Addendum)
Patient here for suture removal, placed on 02/27/13,

## 2013-03-08 NOTE — ED Provider Notes (Signed)
Medical screening examination/treatment/procedure(s) were performed by non-physician practitioner and as supervising physician I was immediately available for consultation/collaboration.  EKG Interpretation   None         Garland Hincapie, MD 03/08/13 0649 

## 2013-08-19 ENCOUNTER — Emergency Department (HOSPITAL_COMMUNITY)
Admission: EM | Admit: 2013-08-19 | Discharge: 2013-08-19 | Disposition: A | Discharge status: Home / Self Care | Payer: Medicaid Other | Attending: Emergency Medicine | Admitting: Emergency Medicine

## 2013-08-19 ENCOUNTER — Encounter (HOSPITAL_COMMUNITY): Payer: Self-pay | Admitting: Emergency Medicine

## 2013-08-19 DIAGNOSIS — R296 Repeated falls: Secondary | ICD-10-CM | POA: Insufficient documentation

## 2013-08-19 DIAGNOSIS — Y9389 Activity, other specified: Secondary | ICD-10-CM | POA: Insufficient documentation

## 2013-08-19 DIAGNOSIS — L02416 Cutaneous abscess of left lower limb: Secondary | ICD-10-CM

## 2013-08-19 DIAGNOSIS — L02419 Cutaneous abscess of limb, unspecified: Secondary | ICD-10-CM | POA: Insufficient documentation

## 2013-08-19 DIAGNOSIS — Y92009 Unspecified place in unspecified non-institutional (private) residence as the place of occurrence of the external cause: Secondary | ICD-10-CM | POA: Insufficient documentation

## 2013-08-19 DIAGNOSIS — IMO0002 Reserved for concepts with insufficient information to code with codable children: Secondary | ICD-10-CM | POA: Insufficient documentation

## 2013-08-19 DIAGNOSIS — L03119 Cellulitis of unspecified part of limb: Secondary | ICD-10-CM

## 2013-08-19 DIAGNOSIS — Z79899 Other long term (current) drug therapy: Secondary | ICD-10-CM | POA: Insufficient documentation

## 2013-08-19 MED ORDER — CEPHALEXIN 500 MG PO CAPS: 500.0000 mg | ORAL_CAPSULE | Freq: Four times a day (QID) | ORAL | Status: AC

## 2013-08-19 MED ORDER — SULFAMETHOXAZOLE-TRIMETHOPRIM 800-160 MG PO TABS: 1.0000 | ORAL_TABLET | Freq: Two times a day (BID) | ORAL | Status: AC

## 2013-08-19 NOTE — ED Provider Notes (Signed)
CSN: [id number]     Arrival date & time 08/19/13  1025 History  This chart was scribed for Nelia Shi, MD by Blanchard Kelch, ED Scribe. The patient was seen in room TR10C/TR10C. Patient's care was started at 10:46 AM.    Chief Complaint  Patient presents with  . Abscess     Patient is a 23 y.o. female presenting with abscess. The history is provided by the patient. No language interpreter was used.  Abscess Associated symptoms: no fever     HPI Comments: Rose Barnes is a 23 y.o. female who presents to the Emergency Department due to a fall that occurred two days ago. She was playing with her son and landed on her left knee, scraping it on her carpet. She is complaining of a constant, worsening abscess to her left knee from where she fell two days ago. She did not originally notice any lacerations or abrasions prior to the abscess appearing. She reports constant pain to the affected area. She denies arthralgias. She is up to date on her tetanus vaccination. Pt did recall shaving in the general affected area the night before, and also think she may have been bitten by an insect when she visited her friend a few days prior.    Past Medical History  Diagnosis Date  . Medical history non-contributory    Past Surgical History  Procedure Laterality Date  . No past surgeries     No family history on file. History  Substance Use Topics  . Smoking status: Never Smoker   . Smokeless tobacco: Not on file  . Alcohol Use: Yes     Comment: social   OB History   Grav Para Term Preterm Abortions TAB SAB Ect Mult Living   1 1 1       1      Review of Systems  Constitutional: Negative for fever.  Musculoskeletal: Negative for arthralgias and joint swelling.  Skin: Positive for wound.      Allergies  Review of patient's allergies indicates no known allergies.  Home Medications   Prior to Admission medications   Medication Sig Start Date End Date Taking? Authorizing Provider   medroxyPROGESTERone (DEPO-PROVERA) 150 MG/ML injection Inject 150 mg into the muscle every 3 (three) months.   Yes Historical Provider, MD   Triage Vitals: BP 111/62  Pulse 87  Temp(Src) 98.5 F (36.9 C) (Oral)  Resp 18  SpO2 99%  LMP 07/29/2013  Breastfeeding? No  Physical Exam  Nursing note and vitals reviewed. Constitutional: She is oriented to person, place, and time. She appears well-developed and well-nourished. No distress.  HENT:  Head: Normocephalic and atraumatic.  Eyes: EOM are normal.  Neck: Neck supple. No tracheal deviation present.  Cardiovascular: Normal rate.   Pulmonary/Chest: Effort normal. No respiratory distress.  Musculoskeletal: Normal range of motion.  Left knee with normal flexion and extension.   Neurological: She is alert and oriented to person, place, and time.  Skin: Skin is warm and dry.  Vesicle less than 1 cm in size with 2 cm circumcentral erythema to surrounding skin on left knee. Tender to palpation. No joint involvement.   Psychiatric: She has a normal mood and affect. Her behavior is normal.    ED Course  Procedures (including critical care time)  DIAGNOSTIC STUDIES: Oxygen Saturation is 99% on room air, normal by my interpretation.    COORDINATION OF CARE: 10:51 AM -Will perform I&D. Patient verbalizes understanding and agrees with treatment plan.  INCISION  AND DRAINAGE Performed by: Fayrene HelperBowie Denene Alamillo Consent: Verbal consent obtained. Risks and benefits: risks, benefits and alternatives were discussed Type: abscess  Body area: L cutaneous anterior knee  Anesthesia: local infiltration  Incision was made with a scalpel.  Local anesthetic: lidocaine 2% w/o epinephrine  Anesthetic total: 0.5 ml  Complexity: simple  Drainage: purulent  Drainage amount: small  Packing material: none  Patient tolerance: Patient tolerated the procedure well with no immediate complications.   11:00 AM Patient has a cutaneous abscess cellulitis  of the left anterior knee without any joint involvement. Successful I&D, we'll discharge with Keflex and Bactrim. Patient is not pregnant and is not breast-feeding. She works in [employer] industry.  Labs Review Labs Reviewed - No data to display  Imaging Review No results found.   EKG Interpretation None      MDM   Final diagnoses:  Cutaneous abscess of left lower extremity    BP 111/62  Pulse 87  Temp(Src) 98.5 F (36.9 C) (Oral)  Resp 18  SpO2 99%  LMP 07/29/2013  Breastfeeding? No   I personally performed the services described in this documentation, which was scribed in my presence. The recorded information has been reviewed and is accurate.     Fayrene HelperBowie Elyse Prevo, PA-C 08/19/13 1101  Fayrene HelperBowie Renarda Mullinix, PA-C 08/19/13 1103

## 2013-08-19 NOTE — ED Notes (Signed)
Pt reports falling on left knee Thursday and has since developed an abscess to the left knee. Denies any n/v/fevers at home, states pain in knee got worse yesterday. reddness noted around the area. nad noted.

## 2013-08-19 NOTE — Discharge Instructions (Signed)
Cellulitis °Cellulitis is an infection of the skin and the tissue under the skin. The infected area is usually red and tender. This happens most often in the arms and lower legs. °HOME CARE  °· Take your antibiotic medicine as told. Finish the medicine even if you start to feel better. °· Keep the infected arm or leg raised (elevated). °· Put a warm cloth on the area up to 4 times per day. °· Only take medicines as told by your doctor. °· Keep all doctor visits as told. °GET HELP RIGHT AWAY IF:  °· You have a fever. °· You feel very sleepy. °· You throw up (vomit) or have watery poop (diarrhea). °· You feel sick and have muscle aches and pains. °· You see red streaks on the skin coming from the infected area. °· Your red area gets bigger or turns a dark color. °· Your bone or joint under the infected area is painful after the skin heals. °· Your infection comes back in the same area or different area. °· You have a puffy (swollen) bump in the infected area. °· You have new symptoms. °MAKE SURE YOU:  °· Understand these instructions. °· Will watch your condition. °· Will get help right away if you are not doing well or get worse. °Document Released: 09/02/2007 Document Revised: 09/15/2011 Document Reviewed: 06/01/2011 °ExitCare® Patient Information ©2014 [employer]. ° °

## 2013-08-19 NOTE — ED Provider Notes (Signed)
Medical screening examination/treatment/procedure(s) were performed by non-physician practitioner and as supervising physician I was immediately available for consultation/collaboration.    Greer Wainright L [place] Yeats, MD 08/19/13 1401 

## 2013-09-28 ENCOUNTER — Encounter (HOSPITAL_COMMUNITY): Payer: Self-pay | Admitting: Emergency Medicine

## 2013-09-28 ENCOUNTER — Emergency Department (HOSPITAL_COMMUNITY)
Admission: EM | Admit: 2013-09-28 | Discharge: 2013-09-28 | Disposition: A | Discharge status: Home / Self Care | Payer: No Typology Code available for payment source | Attending: Emergency Medicine | Admitting: Emergency Medicine

## 2013-09-28 DIAGNOSIS — Z792 Long term (current) use of antibiotics: Secondary | ICD-10-CM | POA: Insufficient documentation

## 2013-09-28 DIAGNOSIS — J029 Acute pharyngitis, unspecified: Secondary | ICD-10-CM | POA: Insufficient documentation
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
## 2013-09-28 NOTE — Discharge Instructions (Signed)
If fever blister does occur, may use over the counter abbreva or other topical medications for relief. Return to the ED for new concerns.
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
## 2013-09-28 NOTE — ED Provider Notes (Signed)
Medical screening examination/treatment/procedure(s) were performed by non-physician practitioner and as supervising physician I was immediately available for consultation/collaboration.   EKG Interpretation None        Darlyn Repsher, MD 09/28/13 2319 

## 2014-01-29 ENCOUNTER — Encounter (HOSPITAL_COMMUNITY): Payer: Self-pay | Admitting: Emergency Medicine

## 2014-03-01 ENCOUNTER — Encounter (HOSPITAL_COMMUNITY): Payer: Self-pay | Admitting: Adult Health

## 2014-03-01 ENCOUNTER — Emergency Department (HOSPITAL_COMMUNITY): Payer: No Typology Code available for payment source

## 2014-03-01 ENCOUNTER — Emergency Department (HOSPITAL_COMMUNITY)
Admission: EM | Admit: 2014-03-01 | Discharge: 2014-03-02 | Disposition: A | Discharge status: Home / Self Care | Payer: No Typology Code available for payment source | Attending: Emergency Medicine | Admitting: Emergency Medicine

## 2014-03-01 DIAGNOSIS — Z79899 Other long term (current) drug therapy: Secondary | ICD-10-CM | POA: Insufficient documentation

## 2014-03-01 DIAGNOSIS — Y9389 Activity, other specified: Secondary | ICD-10-CM | POA: Diagnosis not present

## 2014-03-01 DIAGNOSIS — Y92410 Unspecified street and highway as the place of occurrence of the external cause: Secondary | ICD-10-CM | POA: Insufficient documentation

## 2014-03-01 DIAGNOSIS — S39012A Strain of muscle, fascia and tendon of lower back, initial encounter: Secondary | ICD-10-CM | POA: Diagnosis not present

## 2014-03-01 DIAGNOSIS — Y998 Other external cause status: Secondary | ICD-10-CM | POA: Insufficient documentation

## 2014-03-01 DIAGNOSIS — S199XXA Unspecified injury of neck, initial encounter: Secondary | ICD-10-CM | POA: Diagnosis not present

## 2014-03-01 DIAGNOSIS — S3992XA Unspecified injury of lower back, initial encounter: Secondary | ICD-10-CM | POA: Diagnosis present

## 2014-03-01 DIAGNOSIS — Z3202 Encounter for pregnancy test, result negative: Secondary | ICD-10-CM | POA: Insufficient documentation

## 2014-03-01 DIAGNOSIS — M549 Dorsalgia, unspecified: Secondary | ICD-10-CM

## 2014-03-01 LAB — POC URINE PREG, ED: Preg Test, Ur: NEGATIVE

## 2014-03-01 MED ORDER — OXYCODONE-ACETAMINOPHEN 5-325 MG PO TABS
1.0000 | ORAL_TABLET | Freq: Once | ORAL | Status: AC
  Administered 2014-03-01: 1 via ORAL

## 2014-03-01 MED ORDER — IBUPROFEN 400 MG PO TABS
600.0000 mg | ORAL_TABLET | Freq: Once | ORAL | Status: AC
  Administered 2014-03-01: 600 mg via ORAL

## 2014-03-01 MED ORDER — DIAZEPAM 5 MG PO TABS
5.0000 mg | ORAL_TABLET | Freq: Once | ORAL | Status: AC
  Administered 2014-03-01: 5 mg via ORAL
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
## 2014-03-02 MED ORDER — CYCLOBENZAPRINE HCL 10 MG PO TABS
10.0000 mg | ORAL_TABLET | Freq: Two times a day (BID) | ORAL | Status: AC | PRN
Start: 2014-03-02 — End: ?

## 2014-03-02 MED ORDER — IBUPROFEN 600 MG PO TABS
600.0000 mg | ORAL_TABLET | Freq: Four times a day (QID) | ORAL | Status: AC | PRN
Start: 2014-03-02 — End: ?

## 2014-03-02 MED ORDER — HYDROCODONE-ACETAMINOPHEN 5-325 MG PO TABS: 1.0000 | ORAL_TABLET | Freq: Four times a day (QID) | ORAL | Status: AC | PRN

## 2014-03-02 NOTE — Discharge Instructions (Signed)
Your x-rays look normal today. Take ibuprofen for pain. Norco for severe pain. Take Flexeril for spasms. Follow-up with with primary care doctor for recheck if not improved by late next week.  Lumbosacral Strain Lumbosacral strain is a strain of any of the parts that make up your lumbosacral vertebrae. Your lumbosacral vertebrae are the bones that make up the lower third of your backbone. Your lumbosacral vertebrae are held together by muscles and tough, fibrous tissue (ligaments).  CAUSES  A sudden blow to your back can cause lumbosacral strain. Also, anything that causes an excessive stretch of the muscles in the low back can cause this strain. This is typically seen when people exert themselves strenuously, fall, lift heavy objects, bend, or crouch repeatedly. RISK FACTORS  Physically demanding work.  Participation in pushing or pulling sports or sports that require a sudden twist of the back (tennis, golf, baseball).  Weight lifting.  Excessive lower back curvature.  Forward-tilted pelvis.  Weak back or abdominal muscles or both.  Tight hamstrings. SIGNS AND SYMPTOMS  Lumbosacral strain may cause pain in the area of your injury or pain that moves (radiates) down your leg.  DIAGNOSIS Your health care provider can often diagnose lumbosacral strain through a physical exam. In some cases, you may need tests such as X-ray exams.  TREATMENT  Treatment for your lower back injury depends on many factors that your clinician will have to evaluate. However, most treatment will include the use of anti-inflammatory medicines. HOME CARE INSTRUCTIONS   Avoid hard physical activities (tennis, racquetball, waterskiing) if you are not in proper physical condition for it. This may aggravate or create problems.  If you have a back problem, avoid sports requiring sudden body movements. Swimming and walking are generally safer activities.  Maintain good posture.  Maintain a healthy weight.  For  acute conditions, you may put ice on the injured area.  Put ice in a plastic bag.  Place a towel between your skin and the bag.  Leave the ice on for 20 minutes, 2-3 times a day.  When the low back starts healing, stretching and strengthening exercises may be recommended. SEEK MEDICAL CARE IF:  Your back pain is getting worse.  You experience severe back pain not relieved with medicines. SEEK IMMEDIATE MEDICAL CARE IF:   You have numbness, tingling, weakness, or problems with the use of your arms or legs.  There is a change in bowel or bladder control.  You have increasing pain in any area of the body, including your belly (abdomen).  You notice shortness of breath, dizziness, or feel faint.  You feel sick to your stomach (nauseous), are throwing up (vomiting), or become sweaty.  You notice discoloration of your toes or legs, or your feet get very cold. MAKE SURE YOU:   Understand these instructions.  Will watch your condition.  Will get help right away if you are not doing well or get worse. Document Released: 12/24/2004 Document Revised: 03/21/2013 Document Reviewed: 11/02/2012 [facility] Patient Information 2015 [employer], [place]. This information is not intended to replace advice given to you by your health care provider. Make sure you discuss any questions you have with your health care provider.

## 2018-06-24 ENCOUNTER — Telehealth: Payer: Self-pay | Admitting: Nurse Practitioner

## 2018-06-24 DIAGNOSIS — R05 Cough: Secondary | ICD-10-CM

## 2018-06-24 DIAGNOSIS — R059 Cough, unspecified: Secondary | ICD-10-CM

## 2018-06-24 NOTE — Progress Notes (Signed)

## 2020-08-30 ENCOUNTER — Emergency Department (HOSPITAL_COMMUNITY)
Admission: EM | Admit: 2020-08-30 | Discharge: 2020-08-30 | Disposition: A | Discharge status: Home / Self Care | Payer: Medicaid Other | Attending: Emergency Medicine | Admitting: Emergency Medicine

## 2020-08-30 ENCOUNTER — Other Ambulatory Visit: Payer: Self-pay

## 2020-08-30 ENCOUNTER — Emergency Department (HOSPITAL_COMMUNITY): Payer: Medicaid Other

## 2020-08-30 ENCOUNTER — Encounter (HOSPITAL_COMMUNITY): Payer: Self-pay | Admitting: Pharmacy Technician

## 2020-08-30 DIAGNOSIS — Y9351 Activity, roller skating (inline) and skateboarding: Secondary | ICD-10-CM | POA: Insufficient documentation

## 2020-08-30 DIAGNOSIS — S59901A Unspecified injury of right elbow, initial encounter: Secondary | ICD-10-CM

## 2020-08-30 DIAGNOSIS — S52134A Nondisplaced fracture of neck of right radius, initial encounter for closed fracture: Secondary | ICD-10-CM | POA: Insufficient documentation

## 2020-08-30 DIAGNOSIS — Y9289 Other specified places as the place of occurrence of the external cause: Secondary | ICD-10-CM | POA: Insufficient documentation

## 2020-08-30 DIAGNOSIS — W1839XA Other fall on same level, initial encounter: Secondary | ICD-10-CM | POA: Insufficient documentation

## 2020-08-30 MED ORDER — OXYCODONE-ACETAMINOPHEN 5-325 MG PO TABS
1.0000 | ORAL_TABLET | Freq: Once | ORAL | Status: AC
  Administered 2020-08-30: 1 via ORAL
  Filled 2020-08-30 (×2): qty 1

## 2020-08-30 MED ORDER — OXYCODONE-ACETAMINOPHEN 5-325 MG PO TABS: 1.0000 | ORAL_TABLET | ORAL | 0 refills | Status: AC | PRN

## 2020-08-30 NOTE — ED Provider Notes (Signed)
[facility] EMERGENCY DEPARTMENT Provider Note   CSN: [id number] Arrival date & time: 08/30/20  1649     History Chief Complaint  Patient presents with  . Arm Pain    SARH KIRSCHENBAUM is a 30 y.o. female.  The history is provided by the patient and medical records. No language interpreter was used.  Fall This is a new problem. The current episode started 3 to 5 hours ago. The problem occurs rarely. The problem has not changed since onset.Pertinent negatives include no chest pain, no abdominal pain, no headaches and no shortness of breath. Nothing aggravates the symptoms. Nothing relieves the symptoms. She has tried nothing for the symptoms. The treatment provided no relief.       Past Medical History:  Diagnosis Date  . Medical history non-contributory     There are no problems to display for this patient.   Past Surgical History:  Procedure Laterality Date  . NO PAST SURGERIES       OB History    Gravida  1   Para  1   Term  1   Preterm      AB      Living  1     SAB      IAB      Ectopic      Multiple      Live Births  1           No family history on file.  Social History   Tobacco Use  . Smoking status: Never Smoker  Substance Use Topics  . Alcohol use: Yes    Comment: social  . Drug use: No    Home Medications Prior to Admission medications   Medication Sig Start Date End Date Taking? Authorizing Provider  cephALEXin (KEFLEX) 500 MG capsule Take 1 capsule (500 mg total) by mouth 4 (four) times daily. 08/19/13   Fayrene Helper, PA-C  cyclobenzaprine (FLEXERIL) 10 MG tablet Take 1 tablet (10 mg total) by mouth 2 (two) times daily as needed for muscle spasms. 03/02/14   Kirichenko, Lemont Fillers, PA-C  HYDROcodone-acetaminophen (NORCO) 5-325 MG per tablet Take 1 tablet by mouth every 6 (six) hours as needed for moderate pain. 03/02/14   Kirichenko, Lemont Fillers, PA-C  ibuprofen (ADVIL,MOTRIN) 600 MG tablet Take 1 tablet (600 mg total)  by mouth every 6 (six) hours as needed. 03/02/14   Kirichenko, Lemont Fillers, PA-C  medroxyPROGESTERone (DEPO-PROVERA) 150 MG/ML injection Inject 150 mg into the muscle every 3 (three) months.    [provider]    Allergies    Patient has no known allergies.  Review of Systems   Review of Systems  Constitutional: Negative for fatigue and fever.  Respiratory: Negative for cough, chest tightness and shortness of breath.   Cardiovascular: Negative for chest pain and palpitations.  Gastrointestinal: Negative for abdominal pain, nausea and vomiting.  Musculoskeletal: Negative for arthralgias, back pain, neck pain and neck stiffness.  Skin: Negative for color change, rash and wound.  Neurological: Negative for syncope, weakness, light-headedness, numbness and headaches.  Psychiatric/Behavioral: Negative for agitation.  All other systems reviewed and are negative.   Physical Exam Updated Vital Signs BP 115/88 (BP Location: Left Arm)   Pulse (!) 58   Temp 99.7 F (37.6 C) (Oral)   Resp 14   SpO2 100%   Physical Exam Vitals and nursing note reviewed.  Constitutional:      General: She is not in acute distress.    Appearance: She is  well-developed. She is not ill-appearing, toxic-appearing or diaphoretic.  HENT:     Head: Normocephalic and atraumatic.  Eyes:     Conjunctiva/sclera: Conjunctivae normal.  Cardiovascular:     Rate and Rhythm: Normal rate and regular rhythm.     Heart sounds: No murmur heard.   Pulmonary:     Effort: Pulmonary effort is normal. No respiratory distress.     Breath sounds: Normal breath sounds. No wheezing, rhonchi or rales.  Chest:     Chest wall: No tenderness.  Abdominal:     Palpations: Abdomen is soft.     Tenderness: There is no abdominal tenderness. There is no guarding or rebound.  Musculoskeletal:        General: Tenderness and signs of injury present.     Right elbow: Tenderness present.     Right wrist: No swelling, lacerations,  tenderness, bony tenderness, snuff box tenderness or crepitus. Normal pulse.     Cervical back: Neck supple.     Right lower leg: No edema.     Left lower leg: No edema.     Comments: Tenderness at the elbow and pain with supination/pronation at the wrist with pain to the elbow.  No wrist tenderness on my exam.  No snuffbox tenderness.  Normal grip strength, sensation, strength at the wrist.  Normal pulses.  Skin:    General: Skin is warm and dry.     Capillary Refill: Capillary refill takes less than 2 seconds.     Findings: No erythema or rash.  Neurological:     General: No focal deficit present.     Mental Status: She is alert.  Psychiatric:        Mood and Affect: Mood normal.     ED Results / Procedures / Treatments   Labs (all labs ordered are listed, but only abnormal results are displayed) Labs Reviewed - No data to display  EKG None  Radiology DG Forearm Right  Result Date: 08/30/2020 CLINICAL DATA:  Status post fall. EXAM: RIGHT FOREARM - 2 VIEW COMPARISON:  None. FINDINGS: A small area of cortical irregularity is seen involving the head of the proximal right radius. There is no evidence of dislocation. Soft tissues are unremarkable. IMPRESSION: Findings which may represent a small fracture of the right radial head. Correlation with physical examination of this region is recommended to determine the presence of point tenderness. Additional evaluation with CT is recommended if an acute fracture remains of clinical concern. Electronically Signed   By: Aram Candela M.D.   On: 08/30/2020 18:31   CT Elbow Right Wo Contrast  Result Date: 08/30/2020 CLINICAL DATA:  Elbow fracture EXAM: CT OF THE LOWER RIGHT EXTREMITY WITHOUT CONTRAST TECHNIQUE: Multidetector CT imaging of the right lower extremity was performed according to the standard protocol. COMPARISON:  Plain films earlier today FINDINGS: There is a radial neck fracture, best seen on sagittal reconstructed images. This is  nondisplaced. No subluxation or dislocation. Associated joint effusion. No additional fracture seen. IMPRESSION: Nondisplaced right radial neck fracture. Electronically Signed   By: Charlett Nose M.D.   On: 08/30/2020 21:52    Procedures Procedures   Medications Ordered in ED Medications  oxyCODONE-acetaminophen (PERCOCET/ROXICET) 5-325 MG per tablet 1 tablet (1 tablet Oral Given 08/30/20 1956)    ED Course  I have reviewed the triage vital signs and the nursing notes.  Pertinent labs & imaging results that were available during my care of the patient were reviewed by me and considered in my  medical decision making (see chart for details).    MDM Rules/Calculators/A&P                          Wendall MolaKayla M Grivas is a left handed 30 y.o. female with no significant past medical history who presents with right arm/elbow pain after a fall while rollerblading.  Patient reports that this afternoon, she was rollerblading with her daughter down a hill and when she looked back her daughter was up the hill and she tried to turn around causing her to fall landing on her right arm.  She reports pain in the wrist and the elbow but initially it was just the forearm and wrist.  She presents for evaluation.  She denies loss of consciousness, hitting her head, neck, chest, back, or other extremity pains.  Reports the pain is moderate to severe while moving but at rest it is mild.  She denies any history of bony injuries in the right upper extremity.  On exam, lungs clear and chest is nontender.  Abdomen is nontender.  No focal neurologic deficits.  Normal sensation, strength, and pulses in bilateral upper extremities and normal grip strength.  No snuffbox tenderness or abnormal pulses in her upper extremities.  She did not have wrist tenderness on my exam but did have pain in both the wrist and the elbow with supination and pronation at the wrist.  She did not have forearm pain but did have tenderness at the elbow.   Pain with elbow movement as well.  No tenderness in the humerus area or the shoulder.  No laceration seen.  Exam otherwise unremarkable.  Patient had x-rays in triage which show concern for possible radial head fracture.  CT was recommended if there is clinical concern for fracture.  Given the tenderness and pain with pronation/supination I am concerned about fracture.  Will get the CT scan.  Patient reports he did drive so she wants to wait on further pain medicine until we have a definitive plan.  Anticipate discharge with a sling and orthopedic surgery follow-up and pain medicine after imaging is completed.     CT returned confirming a nondisplaced right radial neck fracture.  10:17 PM Spoke with Dr. Ave Filterhandler with [facility] who will see the patient in clinic next week.  He recommended a posterior long-arm splint and sling as well as follow-up in clinic.  Patient was informed this plan and she agrees.  We will send in a prescription for some pain medicine to use if she needs it and have her follow-up next week.    10:41 PM Splint placed and patient able to move fingers well with normal sensation and strength.  He reports elbow is feeling better now that it is immobilized and splinted.  Patient understands plan and follow-up instructions.  Patient discharged in good condition.  Final Clinical Impression(s) / ED Diagnoses Final diagnoses:  Injury while rollerblading  Elbow injury, right, initial encounter  Closed nondisplaced fracture of neck of right radius, initial encounter    Rx / DC Orders ED Discharge Orders         Ordered    oxyCODONE-acetaminophen (PERCOCET/ROXICET) 5-325 MG tablet  Every 4 hours PRN        08/30/20 2243         Clinical Impression: 1. Injury while rollerblading   2. Elbow injury, right, initial encounter   3. Closed nondisplaced fracture of neck of right radius, initial encounter  Disposition: Discharge  Condition: Good  I have  discussed the results, Dx and Tx plan with the pt(& family if present). He/she/they expressed understanding and agree(s) with the plan. Discharge instructions discussed at great length. Strict return precautions discussed and pt &/or family have verbalized understanding of the instructions. No further questions at time of discharge.    New Prescriptions   OXYCODONE-ACETAMINOPHEN (PERCOCET/ROXICET) 5-325 MG TABLET    Take 1 tablet by mouth every 4 (four) hours as needed for severe pain.    Follow Up: Jackquline Bosch, MD [address] [place] [address] [phone]   with orthopedics team  [facility] EMERGENCY DEPARTMENT [address] [id number] mc [place] [address] [phone]       Nazanin Kinner, Canary Brim, MD 08/30/20 2245

## 2020-08-30 NOTE — ED Provider Notes (Signed)
Emergency Medicine Provider Triage Evaluation Note  Rose Barnes , a 30 y.o. female  was evaluated in triage.  Pt complains of right forearm pain after falling down while skating just prior to arrival.  Denies any other injuries.  Denies possibility of pregnancy.  Review of Systems  Positive: Right forearm pain Negative: Numbness, head injury  Physical Exam  BP 115/88 (BP Location: Left Arm)   Pulse (!) 58   Temp 99.7 F (37.6 C) (Oral)   Resp 14   SpO2 100%  Gen:   Awake, no distress   Resp:  Normal effort  MSK:   Moves extremities without difficulty  Other:  Pain with range of motion of right arm.  Moving digits without difficulty.  2+ radial pulse palpated.  Normal sensation to light touch  Medical Decision Making  Medically screening exam initiated at 5:54 PM.  Appropriate orders placed.  Rose Barnes was informed that the remainder of the evaluation will be completed by another provider, this initial triage assessment does not replace that evaluation, and the importance of remaining in the ED until their evaluation is complete.  X-ray and pain control ordered   Dietrich Pates, PA-C 08/30/20 1755    Tegeler, Canary Brim, MD 08/30/20 1759

## 2020-08-30 NOTE — Discharge Instructions (Signed)
Your history, exam, work-up today are consistent with a an elbow fracture at the radial head.  The x-ray was suggestive in the CT confirmed.  I spoke with Dr. Ave Filter with [facility] who wants to see you in clinic next week to discuss further management.  Please keep the splint in place and use the pain medicine if needed.  Please rest.  If any symptoms change or worsen acutely, please return to the nearest emergency department.

## 2020-08-30 NOTE — ED Triage Notes (Signed)
Pt here with R forearm pain after falling. CNS intact.

## 2022-01-04 ENCOUNTER — Encounter (HOSPITAL_COMMUNITY): Payer: Self-pay

## 2022-01-04 ENCOUNTER — Emergency Department (HOSPITAL_COMMUNITY)
Admission: EM | Admit: 2022-01-04 | Discharge: 2022-01-04 | Disposition: A | Discharge status: Home / Self Care | Payer: Self-pay | Attending: Emergency Medicine | Admitting: Emergency Medicine

## 2022-01-04 ENCOUNTER — Other Ambulatory Visit: Payer: Self-pay

## 2022-01-04 ENCOUNTER — Emergency Department (HOSPITAL_COMMUNITY): Payer: Self-pay

## 2022-01-04 DIAGNOSIS — H53149 Visual discomfort, unspecified: Secondary | ICD-10-CM | POA: Insufficient documentation

## 2022-01-04 DIAGNOSIS — R110 Nausea: Secondary | ICD-10-CM | POA: Insufficient documentation

## 2022-01-04 DIAGNOSIS — R519 Headache, unspecified: Secondary | ICD-10-CM | POA: Insufficient documentation

## 2022-01-04 DIAGNOSIS — Z20822 Contact with and (suspected) exposure to covid-19: Secondary | ICD-10-CM | POA: Insufficient documentation

## 2022-01-04 LAB — CBC WITH DIFFERENTIAL/PLATELET
Abs Immature Granulocytes: 0.02 10*3/uL (ref 0.00–0.07)
Basophils Absolute: 0.1 10*3/uL (ref 0.0–0.1)
Basophils Relative: 1 %
Eosinophils Absolute: 0.2 10*3/uL (ref 0.0–0.5)
Eosinophils Relative: 3 %
HCT: 37.3 % (ref 36.0–46.0)
Hemoglobin: 12.8 g/dL (ref 12.0–15.0)
Immature Granulocytes: 0 %
Lymphocytes Relative: 28 %
Lymphs Abs: 2 10*3/uL (ref 0.7–4.0)
MCH: 27.2 pg (ref 26.0–34.0)
MCHC: 34.3 g/dL (ref 30.0–36.0)
MCV: 79.4 fL — ABNORMAL LOW (ref 80.0–100.0)
Monocytes Absolute: 0.5 10*3/uL (ref 0.1–1.0)
Monocytes Relative: 6 %
Neutro Abs: 4.5 10*3/uL (ref 1.7–7.7)
Neutrophils Relative %: 62 %
Platelets: 260 10*3/uL (ref 150–400)
RBC: 4.7 MIL/uL (ref 3.87–5.11)
RDW: 12.9 % (ref 11.5–15.5)
WBC: 7.3 10*3/uL (ref 4.0–10.5)
nRBC: 0 % (ref 0.0–0.2)

## 2022-01-04 LAB — BASIC METABOLIC PANEL
Anion gap: 5 (ref 5–15)
BUN: 14 mg/dL (ref 6–20)
CO2: 27 mmol/L (ref 22–32)
Calcium: 8.9 mg/dL (ref 8.9–10.3)
Chloride: 106 mmol/L (ref 98–111)
Creatinine, Ser: 0.74 mg/dL (ref 0.44–1.00)
GFR, Estimated: >60 mL/min (ref >60)
Glucose, Bld: 94 mg/dL (ref 70–99)
Potassium: 3.9 mmol/L (ref 3.5–5.1)
Sodium: 138 mmol/L (ref 135–145)

## 2022-01-04 LAB — SARS CORONAVIRUS 2 BY RT PCR: SARS Coronavirus 2 by RT PCR: NEGATIVE

## 2022-01-04 LAB — I-STAT BETA HCG BLOOD, ED (MC, WL, AP ONLY): I-stat hCG, quantitative: <5 m[IU]/mL (ref <5)

## 2022-01-04 MED ORDER — IOHEXOL 350 MG/ML SOLN
75.0000 mL | Freq: Once | INTRAVENOUS | Status: AC | PRN
  Administered 2022-01-04: 75 mL via INTRAVENOUS

## 2022-01-04 NOTE — ED Notes (Signed)
Received verbal report from Chad G RN at this time 

## 2022-01-04 NOTE — ED Triage Notes (Signed)
Patient complains of ongoing headache since Friday. Pain worse on left side and behind left eye. Nausea with same. Denies trauma. Took ibuprofen with minimal relief and photophobia. Has had some chills today with congestion

## 2022-01-04 NOTE — ED Provider Notes (Signed)
[place] EMERGENCY DEPARTMENT Provider Note   CSN: [id number] Arrival date & time: 01/04/22  1217     History  No chief complaint on file.   Rose Barnes is a 31 y.o. female.  HPI Patient presents with headache.  Pain is actually resolved, but over the past 3 days she has had atypical headache.  Where the patient does not typically have headaches, now for the past 3 days she has had frequent episodes of discomfort, left retro-orbital, left parietal, with associated photophobia and auditory exacerbations.  Some nausea, no vomiting, no weakness in any extremity, no true vision loss.  After suffering from headaches for several days, and consulting with friends, she is specifically concerned about possible aneurysm.    Home Medications Prior to Admission medications   Medication Sig Start Date End Date Taking? Authorizing Provider  cephALEXin (KEFLEX) 500 MG capsule Take 1 capsule (500 mg total) by mouth 4 (four) times daily. 08/19/13   Domenic Moras, PA-C  cyclobenzaprine (FLEXERIL) 10 MG tablet Take 1 tablet (10 mg total) by mouth 2 (two) times daily as needed for muscle spasms. 03/02/14   Kirichenko, Lahoma Rocker, PA-C  HYDROcodone-acetaminophen (NORCO) 5-325 MG per tablet Take 1 tablet by mouth every 6 (six) hours as needed for moderate pain. 03/02/14   Kirichenko, Lahoma Rocker, PA-C  ibuprofen (ADVIL,MOTRIN) 600 MG tablet Take 1 tablet (600 mg total) by mouth every 6 (six) hours as needed. 03/02/14   Kirichenko, Lahoma Rocker, PA-C  medroxyPROGESTERone (DEPO-PROVERA) 150 MG/ML injection Inject 150 mg into the muscle every 3 (three) months.    [provider]  oxyCODONE-acetaminophen (PERCOCET/ROXICET) 5-325 MG tablet Take 1 tablet by mouth every 4 (four) hours as needed for severe pain. 08/30/20   Tegeler, Gwenyth Allegra, MD      Allergies    Patient has no known allergies.    Review of Systems   Review of Systems  All other systems reviewed and are negative.   Physical  Exam Updated Vital Signs BP 114/74 (BP Location: Right Arm)   Pulse (!) 54   Temp 98.6 F (37 C) (Oral)   Resp 14   SpO2 99%  Physical Exam Vitals and nursing note reviewed.  Constitutional:      General: She is not in acute distress.    Appearance: She is well-developed.  HENT:     Head: Normocephalic and atraumatic.  Eyes:     Conjunctiva/sclera: Conjunctivae normal.  Pulmonary:     Effort: Pulmonary effort is normal. No respiratory distress.     Breath sounds: No stridor.  Abdominal:     General: There is no distension.  Skin:    General: Skin is warm and dry.  Neurological:     General: No focal deficit present.     Mental Status: She is alert and oriented to person, place, and time.     Cranial Nerves: No cranial nerve deficit.     Motor: No tremor or abnormal muscle tone.     Coordination: Coordination normal.  Psychiatric:        Mood and Affect: Mood normal.     ED Results / Procedures / Treatments   Labs (all labs ordered are listed, but only abnormal results are displayed) Labs Reviewed  CBC WITH DIFFERENTIAL/PLATELET - Abnormal; Notable for the following components:      Result Value   MCV 79.4 (*)    All other components within normal limits  SARS CORONAVIRUS 2 BY RT PCR  BASIC METABOLIC PANEL  I-STAT BETA HCG BLOOD, ED ([facility], [facility], [facility] ONLY)    EKG None  Radiology CT Angio Head W &/Or WO Contrast  Result Date: 01/04/2022 CLINICAL DATA:  Headache, sudden, severe. Cerebral vasospasm suspected. EXAM: CT ANGIOGRAPHY HEAD TECHNIQUE: Multidetector CT imaging of the head was performed using the standard protocol during bolus administration of intravenous contrast. Multiplanar CT image reconstructions and MIPs were obtained to evaluate the vascular anatomy. RADIATION DOSE REDUCTION: This exam was performed according to the departmental dose-optimization program which includes automated exposure control, adjustment of the mA and/or kV according to patient size  and/or use of iterative reconstruction technique. CONTRAST:  96mL OMNIPAQUE IOHEXOL 350 MG/ML SOLN COMPARISON:  None Available. FINDINGS: CT HEAD Brain: No acute infarct, hemorrhage, or mass lesion is present. No significant white matter lesions are present. Deep brain nuclei are within normal limits. The ventricles are of normal size. No significant extraaxial fluid collection is present. The brainstem and cerebellum are within normal limits. Vascular: No hyperdense vessel or unexpected calcification. Skull: Calvarium is intact. No focal lytic or blastic lesions are present. No significant extracranial soft tissue lesion is present. Sinuses: The paranasal sinuses and mastoid air cells are clear. CTA HEAD Anterior circulation: The internal carotid arteries are within normal limits from the high cervical segments through the ICA termini bilaterally. The A1 and M1 segments are normal. No definite anterior communicating artery is present. MCA bifurcations are within normal limits bilaterally. Posterior circulation: The vertebral arteries are codominant. PICA origins are visualized and normal. The vertebrobasilar junction and basilar artery is normal. Left posterior cerebral artery originates from basilar tip. The right posterior cerebral artery is of fetal type. PCA branch vessels are normal bilaterally. Venous sinuses: The dural sinuses are patent. The straight sinus and deep cerebral veins are intact. Cortical veins are within normal limits. No significant vascular malformation is evident. The right transverse sinus is dominant. Anatomic variants: Fetal type right posterior cerebral artery. Review of the MIP images confirms the above findings. IMPRESSION: 1. Normal variant CTA Circle of Willis without significant proximal stenosis, aneurysm, or branch vessel occlusion. 2. Normal CT appearance of the brain. Electronically Signed   By: Marin Roberts M.D.   On: 01/04/2022 19:06    Procedures Procedures     Medications Ordered in ED Medications  iohexol (OMNIPAQUE) 350 MG/ML injection 75 mL (75 mLs Intravenous Contrast Given 01/04/22 1828)    ED Course/ Medical Decision Making/ A&P                           Medical Decision Making Adult female, previously well presents with 3 days of headache, with photophobia, substantial discomfort.  Here she is awake, alert, neurologically unremarkable, hemodynamically stable.  However, with severity of symptoms, new onset of headaches, differential including mass versus aneurysm considered.  Patient had labs, CT, with generally reassuring results, no mass, no hemorrhage, no aneurysm.  Labs unremarkable.  Patient comfortable with discharge.  Amount and/or Complexity of Data Reviewed Labs: ordered. Decision-making details documented in ED Course. Radiology: ordered. Decision-making details documented in ED Course.  Risk Prescription drug management. Decision regarding hospitalization.    Final Clinical Impression(s) / ED Diagnoses Final diagnoses:  Bad headache    Rx / DC Orders ED Discharge Orders     None         Gerhard Munch, MD 01/04/22 1932

## 2022-01-04 NOTE — Discharge Instructions (Signed)
As discussed, your evaluation today has been largely reassuring.  But, it is important that you monitor your condition carefully, and do not hesitate to return to the ED if you develop new, or concerning changes in your condition. ? ?Otherwise, please follow-up with your physician for appropriate ongoing care. ? ?

## 2022-01-04 NOTE — ED Notes (Signed)
Pt not in room at this time

## 2022-01-14 IMAGING — CT CT ELBOW*R* W/O CM
4 series · 15 of 33 positions shown, 18 images · non-contrast
Comparison: Plain films earlier today

CLINICAL DATA: Elbow fracture

EXAM:
CT OF THE LOWER RIGHT EXTREMITY WITHOUT CONTRAST
TECHNIQUE: Multidetector CT imaging of the right lower extremity was performed
according to the standard protocol.

[Series 4: extremity 1.0 b60s · axial · 0.31mm/px · z∈[-495,-404]mm · 5 of 196 slices shown, 7 images]
[im 33/196  soft-tissue]
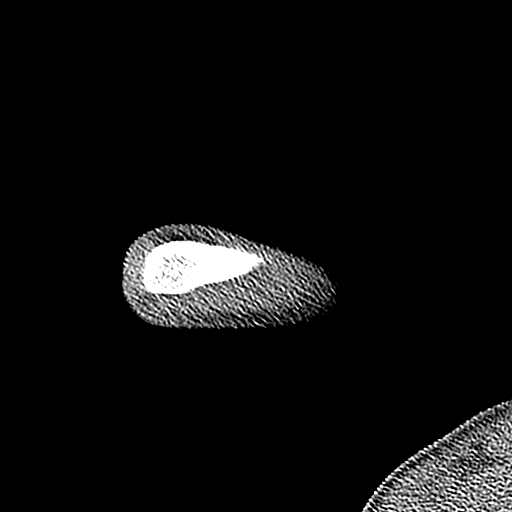
[im 33/196  bone]
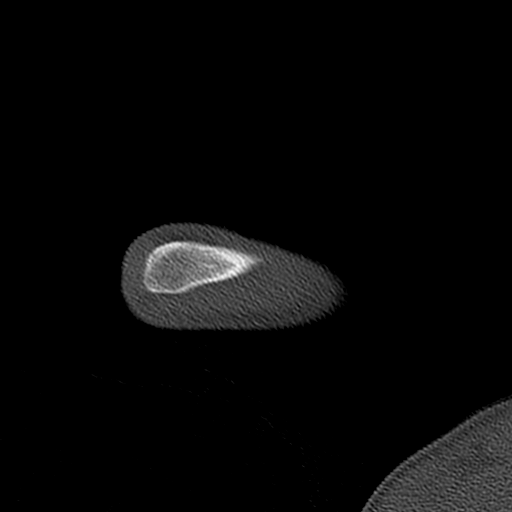
[im 66/196  bone]
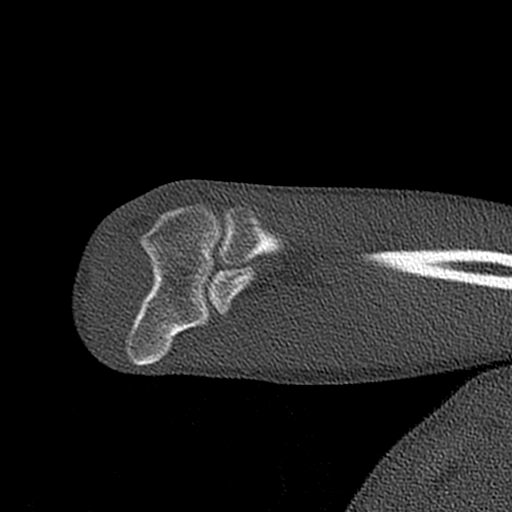
[im 98/196  bone]
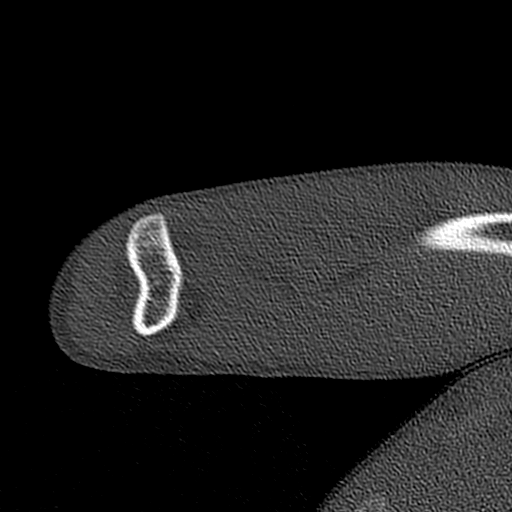
[im 131/196  bone]
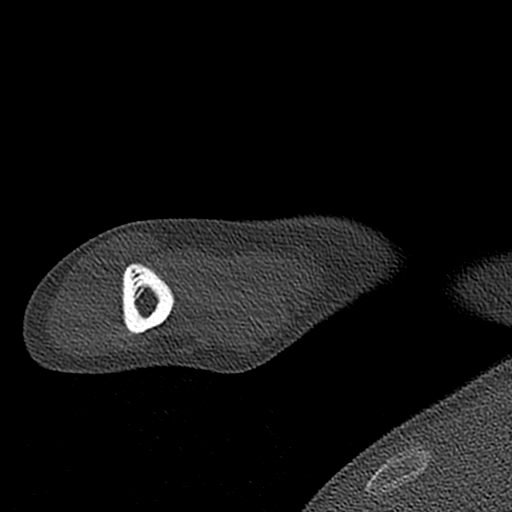
[im 163/196  soft-tissue]
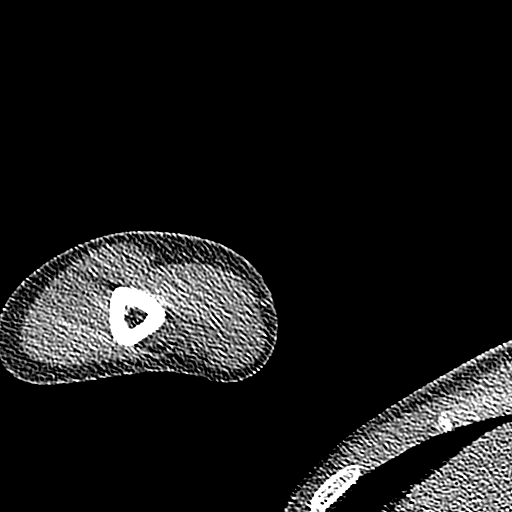
[im 163/196  bone]
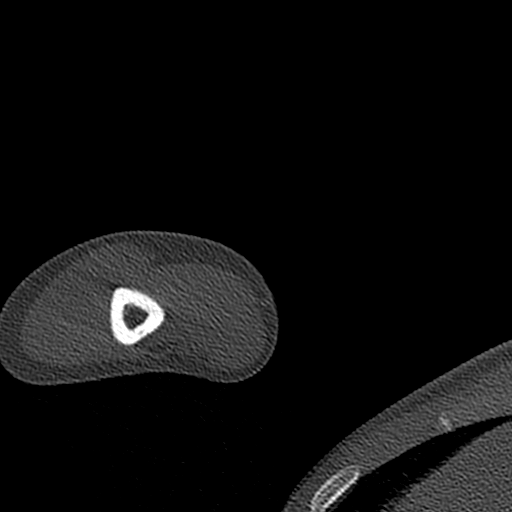

[Series 5: r elbow axial bone · axial · 0.31mm/px · z∈[-510,-488]mm · 2 of 195 slices shown]
[im 33/195  bone]
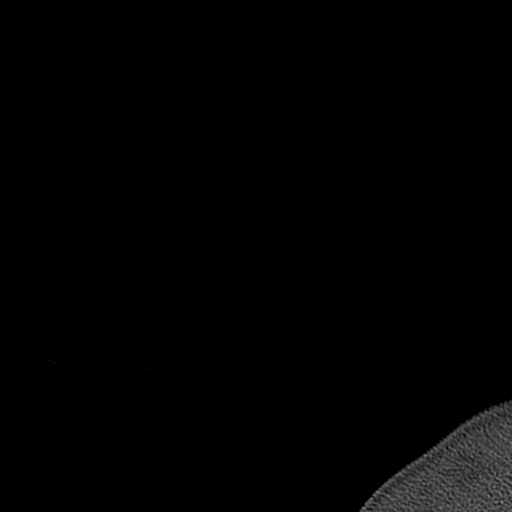
[im 65/195  bone]
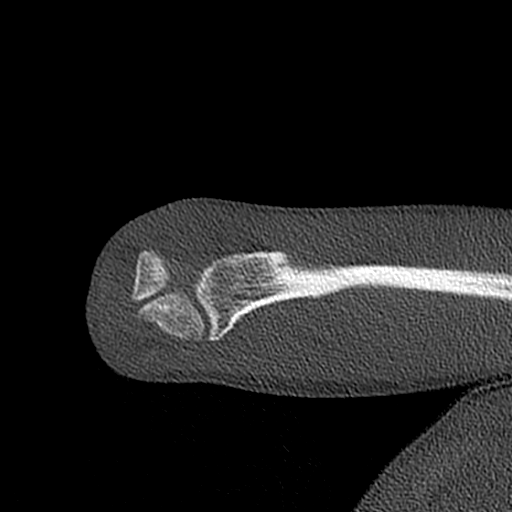

[Series 6: r elbow coronal bone · coronal · 0.26mm/px · 3 of 229 slices shown]
[im 46/229  bone]
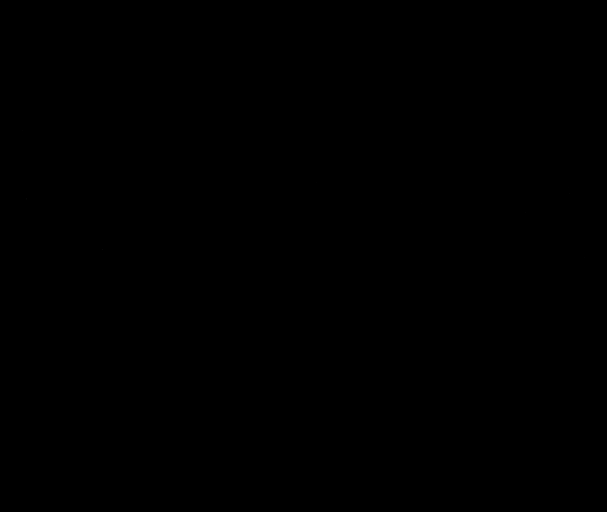
[im 92/229  bone]
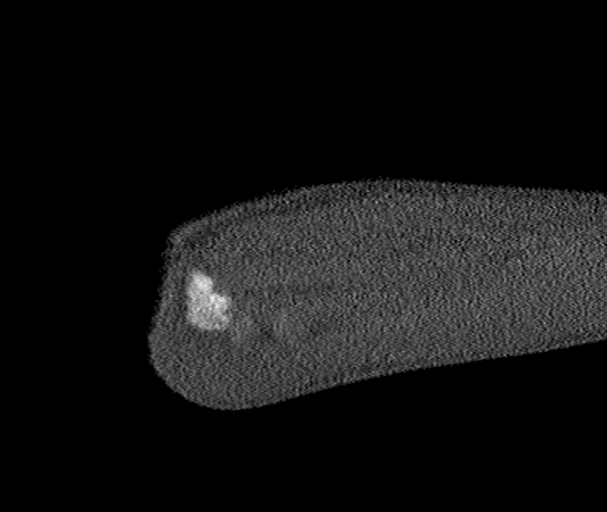
[im 137/229  bone]
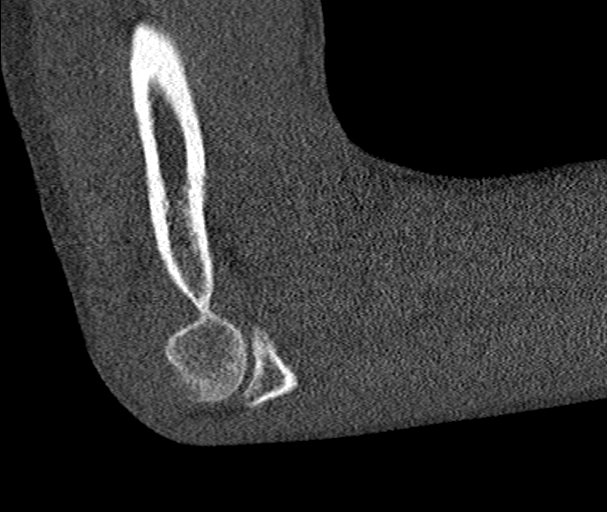

[Series 7: r elbow sagittal bone · sagittal · 0.26mm/px · 5 of 229 slices shown, 6 images]
[im 77/229  bone]
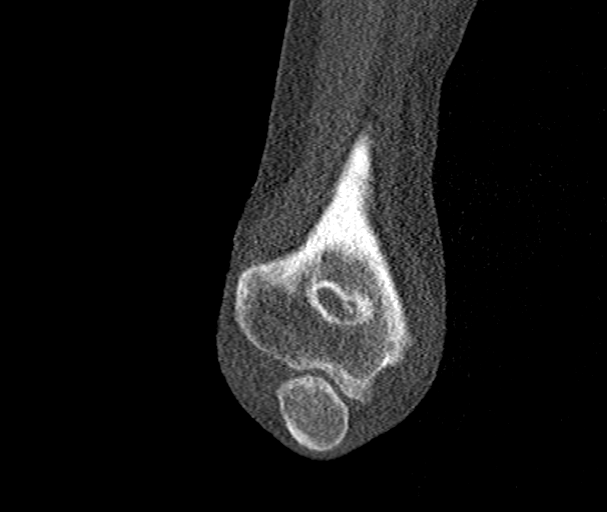
[im 96/229  bone]
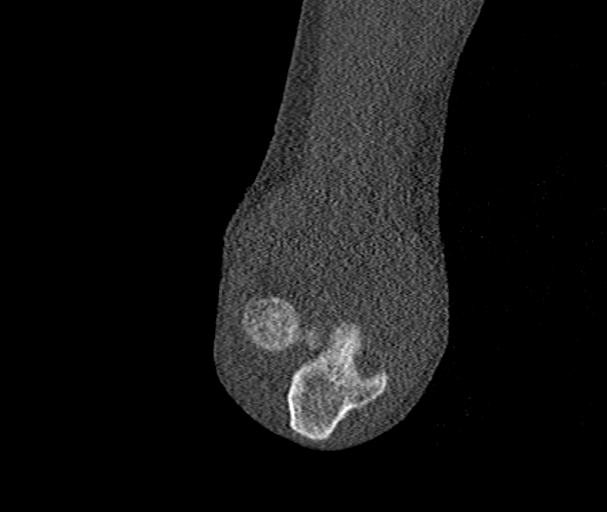
[im 115/229  soft-tissue]
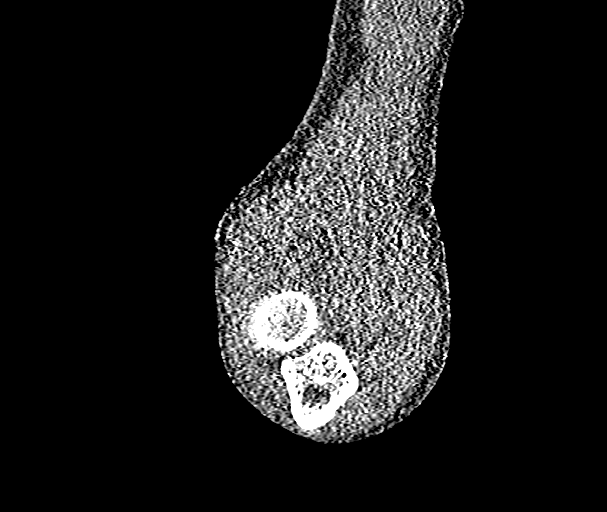
[im 115/229  bone]
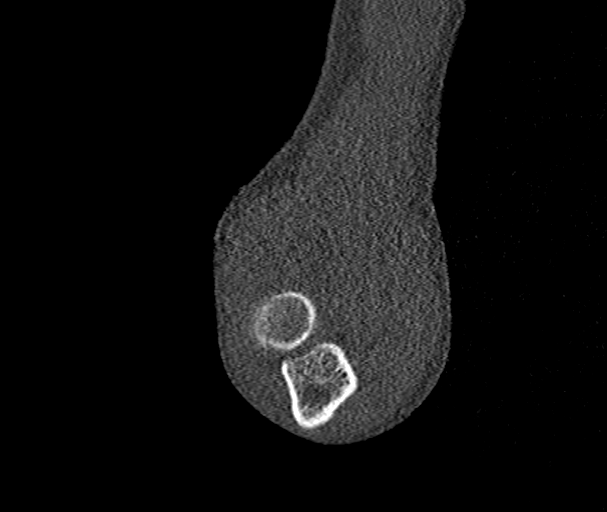
[im 134/229  bone]
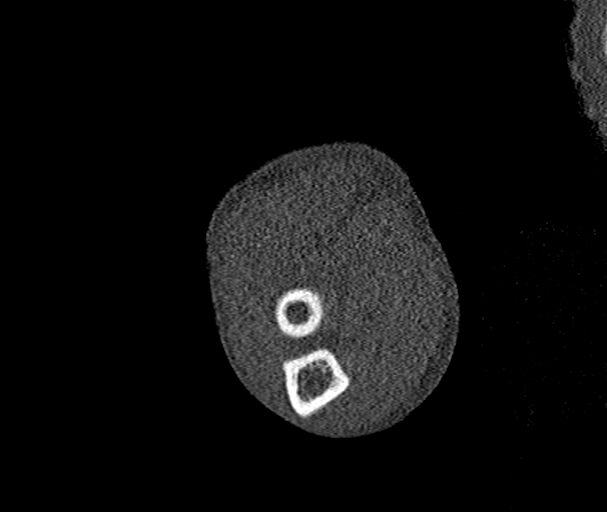
[im 153/229  bone]
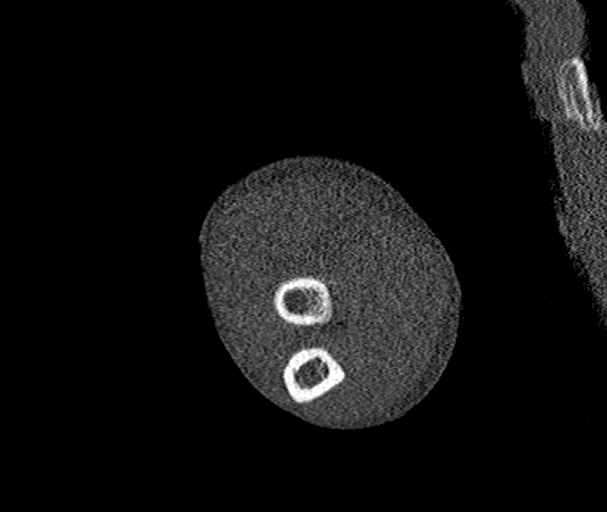

[15 of 33 positions shown; findings below may reference images not displayed]

FINDINGS: There is a radial neck fracture, best seen on sagittal reconstructed
images. This is nondisplaced. No subluxation or dislocation.
Associated joint effusion. No additional fracture seen.
IMPRESSION: Nondisplaced right radial neck fracture.
